# Patient Record
Sex: Male | Born: 1935 | Race: White | Hispanic: No | Marital: Married | Smoking: Former smoker
Health system: Southern US, Community
[De-identification: ages and names within clinical notes are randomized; demographics above are authoritative.]

## PROBLEM LIST (undated history)

## (undated) DIAGNOSIS — C801 Malignant (primary) neoplasm, unspecified: Secondary | ICD-10-CM

## (undated) DIAGNOSIS — I10 Essential (primary) hypertension: Secondary | ICD-10-CM

## (undated) DIAGNOSIS — K219 Gastro-esophageal reflux disease without esophagitis: Secondary | ICD-10-CM

## (undated) DIAGNOSIS — E785 Hyperlipidemia, unspecified: Secondary | ICD-10-CM

## (undated) DIAGNOSIS — E079 Disorder of thyroid, unspecified: Secondary | ICD-10-CM

## (undated) DIAGNOSIS — R7303 Prediabetes: Secondary | ICD-10-CM

## (undated) HISTORY — DX: Disorder of thyroid, unspecified: E07.9

## (undated) HISTORY — DX: Hyperlipidemia, unspecified: E78.5

## (undated) HISTORY — DX: Prediabetes: R73.03

## (undated) HISTORY — DX: Essential (primary) hypertension: I10

## (undated) HISTORY — DX: Gastro-esophageal reflux disease without esophagitis: K21.9

---

## 1998-06-30 ENCOUNTER — Encounter: Payer: Self-pay | Admitting: Internal Medicine

## 1998-06-30 ENCOUNTER — Ambulatory Visit (HOSPITAL_COMMUNITY): Admission: RE | Admit: 1998-06-30 | Discharge: 1998-06-30 | Payer: Self-pay | Admitting: Internal Medicine

## 2000-01-23 ENCOUNTER — Encounter: Payer: Self-pay | Admitting: Emergency Medicine

## 2000-01-23 ENCOUNTER — Encounter: Payer: Self-pay | Admitting: Internal Medicine

## 2000-01-23 ENCOUNTER — Emergency Department (HOSPITAL_COMMUNITY): Admission: EM | Admit: 2000-01-23 | Discharge: 2000-01-23 | Payer: Self-pay | Admitting: Emergency Medicine

## 2000-06-11 HISTORY — PX: CYSTOSCOPY: SUR368

## 2000-11-20 ENCOUNTER — Encounter: Payer: Self-pay | Admitting: Urology

## 2000-11-20 ENCOUNTER — Ambulatory Visit (HOSPITAL_COMMUNITY): Admission: RE | Admit: 2000-11-20 | Discharge: 2000-11-20 | Payer: Self-pay | Admitting: Urology

## 2000-11-25 ENCOUNTER — Ambulatory Visit (HOSPITAL_COMMUNITY): Admission: RE | Admit: 2000-11-25 | Discharge: 2000-11-25 | Payer: Self-pay | Admitting: Urology

## 2001-08-05 ENCOUNTER — Encounter: Payer: Self-pay | Admitting: Internal Medicine

## 2001-08-05 ENCOUNTER — Ambulatory Visit (HOSPITAL_COMMUNITY): Admission: RE | Admit: 2001-08-05 | Discharge: 2001-08-05 | Payer: Self-pay | Admitting: Internal Medicine

## 2002-09-30 ENCOUNTER — Ambulatory Visit (HOSPITAL_COMMUNITY): Admission: RE | Admit: 2002-09-30 | Discharge: 2002-09-30 | Payer: Self-pay | Admitting: Internal Medicine

## 2002-09-30 ENCOUNTER — Encounter: Payer: Self-pay | Admitting: Internal Medicine

## 2005-12-27 ENCOUNTER — Emergency Department (HOSPITAL_COMMUNITY): Admission: EM | Admit: 2005-12-27 | Discharge: 2005-12-27 | Payer: Self-pay | Admitting: Emergency Medicine

## 2013-04-29 ENCOUNTER — Other Ambulatory Visit: Payer: Self-pay | Admitting: Internal Medicine

## 2013-05-12 ENCOUNTER — Encounter: Payer: Self-pay | Admitting: Internal Medicine

## 2013-05-12 ENCOUNTER — Ambulatory Visit (INDEPENDENT_AMBULATORY_CARE_PROVIDER_SITE_OTHER): Payer: Medicare Other | Admitting: Internal Medicine

## 2013-05-12 VITALS — BP 142/80 | HR 56 | Temp 98.2°F | Resp 18 | Ht 71.5 in | Wt 226.6 lb

## 2013-05-12 DIAGNOSIS — R7309 Other abnormal glucose: Secondary | ICD-10-CM

## 2013-05-12 DIAGNOSIS — I10 Essential (primary) hypertension: Secondary | ICD-10-CM | POA: Insufficient documentation

## 2013-05-12 DIAGNOSIS — Z125 Encounter for screening for malignant neoplasm of prostate: Secondary | ICD-10-CM

## 2013-05-12 DIAGNOSIS — E669 Obesity, unspecified: Secondary | ICD-10-CM | POA: Insufficient documentation

## 2013-05-12 DIAGNOSIS — Z1212 Encounter for screening for malignant neoplasm of rectum: Secondary | ICD-10-CM

## 2013-05-12 DIAGNOSIS — Z79899 Other long term (current) drug therapy: Secondary | ICD-10-CM

## 2013-05-12 DIAGNOSIS — E782 Mixed hyperlipidemia: Secondary | ICD-10-CM | POA: Insufficient documentation

## 2013-05-12 DIAGNOSIS — E039 Hypothyroidism, unspecified: Secondary | ICD-10-CM | POA: Insufficient documentation

## 2013-05-12 DIAGNOSIS — M159 Polyosteoarthritis, unspecified: Secondary | ICD-10-CM | POA: Insufficient documentation

## 2013-05-12 DIAGNOSIS — E559 Vitamin D deficiency, unspecified: Secondary | ICD-10-CM | POA: Insufficient documentation

## 2013-05-12 LAB — HEPATIC FUNCTION PANEL
AST: 17 U/L (ref 0–37)
Albumin: 4.1 g/dL (ref 3.5–5.2)
Alkaline Phosphatase: 57 U/L (ref 39–117)
Bilirubin, Direct: 0.3 mg/dL (ref 0.0–0.3)
Indirect Bilirubin: 0.7 mg/dL (ref 0.0–0.9)
Total Bilirubin: 1 mg/dL (ref 0.3–1.2)

## 2013-05-12 LAB — CBC WITH DIFFERENTIAL/PLATELET
Basophils Absolute: 0 10*3/uL (ref 0.0–0.1)
Eosinophils Relative: 1 % (ref 0–5)
HCT: 42.7 % (ref 39.0–52.0)
Lymphocytes Relative: 20 % (ref 12–46)
Lymphs Abs: 1.6 10*3/uL (ref 0.7–4.0)
MCH: 30.6 pg (ref 26.0–34.0)
MCV: 88.4 fL (ref 78.0–100.0)
Monocytes Absolute: 0.9 10*3/uL (ref 0.1–1.0)
Neutrophils Relative %: 68 % (ref 43–77)
Platelets: 262 10*3/uL (ref 150–400)
RBC: 4.83 MIL/uL (ref 4.22–5.81)
RDW: 15.6 % — ABNORMAL HIGH (ref 11.5–15.5)
WBC: 8.1 10*3/uL (ref 4.0–10.5)

## 2013-05-12 LAB — BASIC METABOLIC PANEL WITH GFR
CO2: 31 mEq/L (ref 19–32)
Calcium: 8.7 mg/dL (ref 8.4–10.5)
Chloride: 103 mEq/L (ref 96–112)
GFR, Est Non African American: 77 mL/min
Glucose, Bld: 102 mg/dL — ABNORMAL HIGH (ref 70–99)
Potassium: 4.4 mEq/L (ref 3.5–5.3)
Sodium: 140 mEq/L (ref 135–145)

## 2013-05-12 LAB — TSH: TSH: 1.874 u[IU]/mL (ref 0.350–4.500)

## 2013-05-12 LAB — LIPID PANEL
HDL: 41 mg/dL (ref >39)
Total CHOL/HDL Ratio: 3.2 Ratio
Triglycerides: 45 mg/dL (ref <150)
VLDL: 9 mg/dL (ref 0–40)

## 2013-05-12 LAB — MAGNESIUM: Magnesium: 2 mg/dL (ref 1.5–2.5)

## 2013-05-12 NOTE — Progress Notes (Signed)
Patient ID: Kevin Ellison, male   DOB: December 05, 1933, 79 y.o.   MRN: [id number]  Annual Screening Comprehensive Examination  This very nice 79 yo MWM  presents for complete physical.  Patient has been followed for HTN (1994), Prediabetes, DJD, Hyperlipidemia, and Vit D Deficiency.   Patient's BP has been controlled at home. Patient denies any cardiac Symptoms as chest pain, palpitations, shortness of breath, dizziness or ankle swelling.   Patient's hyperlipidemia is controlled with diet and medications. Patient denies myalgias or other medication SE's. Last cholesterol last visit was 136, HDL 45, and LDL 77 at goal in September.     Patient has prediabetes with prior borderline high A1c 5.7% last year and wit 25# weight loss over the last year with better diet last A1c dropped to 5.0% in June this year. Patient denies reactive hypoglycemic symptoms, visual blurring, diabetic polys, or paresthesias.    Patient has DJD, with hx/0o multiple arthralgias apparently controlled on meloxicam.    Finally, patient has history of Vitamin D Deficiency with last vitamin D 78 in June.       Medication List         bisoprolol-hydrochlorothiazide 10-6.25 MG per tablet  Commonly known as:  ZIAC  Take one tablet by mouth every morning  for blood pressure     finasteride 1 MG tablet  Commonly known as:  PROPECIA  Take 1 mg by mouth daily.     finasteride 5 MG tablet  Commonly known as:  PROSCAR     FLUVIRIN Inj injection  Generic drug:  influenza (>/= 3 years) inactive virus vaccine     levothyroxine 50 MCG tablet  Commonly known as:  SYNTHROID, LEVOTHROID     losartan 100 MG tablet  Commonly known as:  COZAAR     meloxicam 15 MG tablet  Commonly known as:  MOBIC  Take 15 mg by mouth daily.     minoxidil 10 MG tablet  Commonly known as:  LONITEN  Take 10 mg by mouth daily.     pravastatin 40 MG tablet  Commonly known as:  PRAVACHOL  Take 40 mg by mouth daily.     tamsulosin 0.4 MG Caps  capsule  Commonly known as:  FLOMAX  Take 0.4 mg by mouth.         Allergies  Allergen Reactions  . Cardura [Doxazosin Mesylate]     History reviewed. No pertinent past medical history.  History reviewed. No pertinent past surgical history.  No family history on file.  History   Social History  . Marital Status: Married x 56 yrs - wife 23 yo    Spouse Name: N/A    Number of Children: N/A  . Years of Education: N/A   Occupational History  . Not on file.   Social History Main Topics  . Smoking status: cig x 15 yrs - quit 1989  . Smokeless tobacco: none  . Alcohol Use: 1.0 oz/week    2 drink(s) per week  . Sexual Activity: Not on file      ROS Constitutional: Denies fever, chills, weight loss/gain, headaches, insomnia, fatigue, night sweats, and change in appetite. Eyes: Denies redness, blurred vision, diplopia, discharge, itchy, watery eyes.  ENT: Denies discharge, congestion, post nasal drip, epistaxis, sore throat, earache, hearing loss, dental pain, Tinnitus, Vertigo, Sinus pain, snoring.  Cardio: Denies chest pain, palpitations, irregular heartbeat, syncope, dyspnea, diaphoresis, orthopnea, PND, claudication, edema Respiratory: denies cough, dyspnea, DOE, pleurisy, hoarseness, laryngitis, wheezing.  Gastrointestinal: Denies dysphagia, heartburn,  reflux, water brash, pain, cramps, nausea, vomiting, bloating, diarrhea, constipation, hematemesis, melena, hematochezia, jaundice, hemorrhoids Genitourinary: Denies dysuria, frequency, urgency, nocturia, hesitancy, discharge, hematuria, flank pain Musculoskeletal: Denies arthralgia, myalgia, stiffness, Jt. Swelling, pain, limp, and strain/sprain. Skin: Denies puritis, rash, hives, warts, acne, eczema, changing in skin lesion Neuro: Weakness, tremor, incoordination, spasms, paresthesia, pain Psychiatric: Denies confusion, memory loss, sensory loss Endocrine: Denies change in weight, skin, hair change, nocturia, and  paresthesia, diabetic polys, visual blurring, hyper /hypo glycemic episodes.  Heme/Lymph: No excessive bleeding, bruising, enlarged lymph nodes.  Filed Vitals:   05/12/13 0904  BP: 142/80  Pulse: 56  Temp: 98.2 F (36.8 C)  Resp: 18   Estimated body mass index is 31.17 kg/(m^2) as calculated from the following:   Height as of this encounter: 5' 11.5" (1.816 m).   Weight as of this encounter: 226 lb 9.6 oz (102.785 kg).  Physical Exam General Appearance: Well nourished, in no apparent distress. Eyes: PERRLA, EOMs, conjunctiva no swelling or erythema, normal fundi and vessels. Sinuses: No frontal/maxillary tenderness ENT/Mouth: EACs patent / TMs  nl. Nares clear without erythema, swelling, mucoid exudates. Oral hygiene is good. No erythema, swelling, or exudate. Tongue normal, non-obstructing. Tonsils not swollen or erythematous. Hearing normal.  Neck: Supple, thyroid normal. No bruits, nodes or JVD. Respiratory: Respiratory effort normal.  BS equal and clear bilateral without rales, rhonci, wheezing or stridor. Cardio: Heart sounds are normal with regular rate and rhythm and no murmurs, rubs or gallops. Peripheral pulses are normal and equal bilaterally without edema. No aortic or femoral bruits. Chest: symmetric with normal excursions and percussion.  Abdomen: Flat, soft, with bowl sounds. Nontender, no guarding, rebound, hernias, masses, or organomegaly.  Lymphatics: Non tender without lymphadenopathy.  Genitourinary: No hernias.Testes nl. DRE - prostate nl for age - smooth & firm w/o nodules. Musculoskeletal: Full ROM all peripheral extremities, joint stability, 5/5 strength, and normal gait. Skin: Warm and dry without rashes, lesions, cyanosis, clubbing or  ecchymosis.  Neuro: Cranial nerves intact, reflexes equal bilaterally. Normal muscle tone, no cerebellar symptoms. Sensation intact.  Pysch: Awake and oriented X 3, normal affect, Insight and Judgment appropriate.   Assessment  and Plan  1. Annual Screening Examination 2. Hypertension  3. Hyperlipidemia 4. Pre Diabetes 5. Vitamin D Deficiency 6. DJD 7. Hypothyroidism   Continue prudent diet as discussed, weight control, regular exercise, and medications. Routine screening labs and tests as requested with regular follow-up as recommended.

## 2013-05-12 NOTE — Patient Instructions (Signed)

## 2013-05-13 LAB — VITAMIN D 25 HYDROXY (VIT D DEFICIENCY, FRACTURES): Vit D, 25-Hydroxy: 88 ng/mL (ref 30–89)

## 2013-05-13 LAB — URINALYSIS, MICROSCOPIC ONLY
Bacteria, UA: NONE SEEN
Casts: NONE SEEN
Crystals: NONE SEEN
Squamous Epithelial / LPF: NONE SEEN

## 2013-05-13 LAB — MICROALBUMIN / CREATININE URINE RATIO
Creatinine, Urine: 151 mg/dL
Microalb Creat Ratio: 6.6 mg/g (ref 0.0–30.0)

## 2013-05-13 LAB — PSA: PSA: 0.62 ng/mL (ref <=4.00)

## 2013-05-13 LAB — INSULIN, FASTING: Insulin fasting, serum: 9 u[IU]/mL (ref 3–28)

## 2013-05-27 ENCOUNTER — Other Ambulatory Visit: Payer: Self-pay | Admitting: Internal Medicine

## 2013-05-27 DIAGNOSIS — I10 Essential (primary) hypertension: Secondary | ICD-10-CM

## 2013-05-27 MED ORDER — FUROSEMIDE 40 MG PO TABS: 40.0000 mg | ORAL_TABLET | Freq: Every day | ORAL | Status: DC

## 2013-06-28 ENCOUNTER — Other Ambulatory Visit: Payer: Self-pay | Admitting: Internal Medicine

## 2013-08-08 DIAGNOSIS — K2100 Gastro-esophageal reflux disease with esophagitis, without bleeding: Secondary | ICD-10-CM | POA: Insufficient documentation

## 2013-08-08 DIAGNOSIS — R7303 Prediabetes: Secondary | ICD-10-CM | POA: Insufficient documentation

## 2013-08-11 ENCOUNTER — Encounter: Payer: Self-pay | Admitting: Internal Medicine

## 2013-08-11 ENCOUNTER — Ambulatory Visit (INDEPENDENT_AMBULATORY_CARE_PROVIDER_SITE_OTHER): Payer: Medicare Other | Admitting: Internal Medicine

## 2013-08-11 VITALS — BP 128/68 | HR 48 | Temp 98.1°F | Resp 18 | Wt 228.8 lb

## 2013-08-11 DIAGNOSIS — E559 Vitamin D deficiency, unspecified: Secondary | ICD-10-CM

## 2013-08-11 DIAGNOSIS — R7309 Other abnormal glucose: Secondary | ICD-10-CM

## 2013-08-11 DIAGNOSIS — E782 Mixed hyperlipidemia: Secondary | ICD-10-CM

## 2013-08-11 DIAGNOSIS — I10 Essential (primary) hypertension: Secondary | ICD-10-CM

## 2013-08-11 DIAGNOSIS — Z79899 Other long term (current) drug therapy: Secondary | ICD-10-CM

## 2013-08-11 DIAGNOSIS — B351 Tinea unguium: Secondary | ICD-10-CM

## 2013-08-11 LAB — CBC WITH DIFFERENTIAL/PLATELET
BASOS ABS: 0 10*3/uL (ref 0.0–0.1)
BASOS PCT: 0 % (ref 0–1)
EOS PCT: 1 % (ref 0–5)
Eosinophils Absolute: 0.1 10*3/uL (ref 0.0–0.7)
HEMATOCRIT: 41.6 % (ref 39.0–52.0)
Hemoglobin: 14.5 g/dL (ref 13.0–17.0)
Lymphocytes Relative: 18 % (ref 12–46)
Lymphs Abs: 1.5 10*3/uL (ref 0.7–4.0)
MCH: 30.2 pg (ref 26.0–34.0)
MCHC: 34.9 g/dL (ref 30.0–36.0)
MCV: 86.7 fL (ref 78.0–100.0)
MONO ABS: 0.8 10*3/uL (ref 0.1–1.0)
Monocytes Relative: 10 % (ref 3–12)
NEUTROS ABS: 5.8 10*3/uL (ref 1.7–7.7)
Neutrophils Relative %: 71 % (ref 43–77)
Platelets: 262 10*3/uL (ref 150–400)
RBC: 4.8 MIL/uL (ref 4.22–5.81)
RDW: 15.4 % (ref 11.5–15.5)
WBC: 8.1 10*3/uL (ref 4.0–10.5)

## 2013-08-11 LAB — BASIC METABOLIC PANEL WITH GFR
BUN: 16 mg/dL (ref 6–23)
CHLORIDE: 103 meq/L (ref 96–112)
CO2: 27 mEq/L (ref 19–32)
Calcium: 8.7 mg/dL (ref 8.4–10.5)
Creat: 1.06 mg/dL (ref 0.50–1.35)
GFR, EST AFRICAN AMERICAN: 78 mL/min
GFR, Est Non African American: 67 mL/min
GLUCOSE: 99 mg/dL (ref 70–99)
POTASSIUM: 4.3 meq/L (ref 3.5–5.3)
Sodium: 140 mEq/L (ref 135–145)

## 2013-08-11 LAB — TSH: TSH: 2.801 u[IU]/mL (ref 0.350–4.500)

## 2013-08-11 LAB — HEMOGLOBIN A1C
Hgb A1c MFr Bld: 5.4 % (ref <5.7)
Mean Plasma Glucose: 108 mg/dL (ref <117)

## 2013-08-11 LAB — LIPID PANEL
Cholesterol: 125 mg/dL (ref 0–200)
HDL: 40 mg/dL (ref >39)
LDL CALC: 71 mg/dL (ref 0–99)
Total CHOL/HDL Ratio: 3.1 Ratio
Triglycerides: 68 mg/dL (ref <150)
VLDL: 14 mg/dL (ref 0–40)

## 2013-08-11 LAB — HEPATIC FUNCTION PANEL
ALBUMIN: 4 g/dL (ref 3.5–5.2)
ALK PHOS: 57 U/L (ref 39–117)
ALT: 18 U/L (ref 0–53)
AST: 14 U/L (ref 0–37)
BILIRUBIN INDIRECT: 0.8 mg/dL (ref 0.2–1.2)
Bilirubin, Direct: 0.2 mg/dL (ref 0.0–0.3)
Total Bilirubin: 1 mg/dL (ref 0.2–1.2)
Total Protein: 6.4 g/dL (ref 6.0–8.3)

## 2013-08-11 LAB — MAGNESIUM: Magnesium: 2.1 mg/dL (ref 1.5–2.5)

## 2013-08-11 MED ORDER — TERBINAFINE HCL 250 MG PO TABS: 250.0000 mg | ORAL_TABLET | Freq: Every day | ORAL | Status: DC

## 2013-08-11 NOTE — Patient Instructions (Signed)

## 2013-08-11 NOTE — Progress Notes (Signed)
Patient ID: Kevin Ellison, male   DOB: 12-Jan-1934, 80 y.o.   MRN: [id number]    This very nice 80 y.o. MWM presents for 3 month follow up with Hypertension, Hyperlipidemia, Pre-Diabetes, DJD and Vitamin D Deficiency.    HTN predates since 38. BP has been controlled at home. Today's BP: 128/68 mmHg . Patient denies any cardiac type chest pain, palpitations, dyspnea/orthopnea/PND, dizziness, claudication, or dependent edema.   Hyperlipidemia is controlled with diet & meds.Patient denies myalgias or other med SE's. Last lipds as below are at goal Lab Results  Component Value Date   CHOL 132 05/12/2013   HDL 41 05/12/2013   LDLCALC 82 05/12/2013   TRIG 45 05/12/2013   CHOLHDL 3.2 05/12/2013      Also, the patient has history of PreDiabetes A1c 5.7% since July 2012  with last A1c of 5.6% in Dec 2014. Patient denies any symptoms of reactive hypoglycemia, diabetic polys, paresthesias or visual blurring.   Further, Patient has history of Vitamin D Deficiency of 1 in 2008 with last vitamin D of 88 in Dec 2014. Patient supplements vitamin D without any suspected side-effects.    Medication List       bisoprolol-hydrochlorothiazide 10-6.25 MG per tablet  Commonly known as:  ZIAC  Take one tablet by mouth every morning  for blood pressure     finasteride 1 MG tablet  Commonly known as:  PROPECIA  Take 1 mg by mouth daily.     finasteride 5 MG tablet  Commonly known as:  PROSCAR  Take 1 tablet by mouth daily for prostate     FLUVIRIN Inj injection  Generic drug:  influenza (>/= 3 years) inactive virus vaccine     furosemide 40 MG tablet  Commonly known as:  LASIX  Take 1 tablet (40 mg total) by mouth daily. For BP and fluid     levothyroxine 50 MCG tablet  Commonly known as:  SYNTHROID, LEVOTHROID     losartan 100 MG tablet  Commonly known as:  COZAAR     meloxicam 15 MG tablet  Commonly known as:  MOBIC  Take 15 mg by mouth daily.     minoxidil 10 MG tablet  Commonly known as:   LONITEN  Take 10 mg by mouth daily.     pravastatin 40 MG tablet  Commonly known as:  PRAVACHOL  Take 40 mg by mouth daily.     tamsulosin 0.4 MG Caps capsule  Commonly known as:  FLOMAX  Take 0.4 mg by mouth.     terbinafine 250 MG tablet  Commonly known as:  LAMISIL  Take 1 tablet (250 mg total) by mouth daily. For toenail fungus         Allergies  Allergen Reactions  . Ace Inhibitors     cough  . Cardura [Doxazosin Mesylate]     PMHx:   Past Medical History  Diagnosis Date  . Hyperlipidemia   . Hypertension   . Thyroid disease   . Prediabetes   . GERD (gastroesophageal reflux disease)     FHx:    Reviewed / unchanged  SHx:    Reviewed / unchanged  Systems Review: Constitutional: Denies fever, chills, wt changes, headaches, insomnia, fatigue, night sweats, change in appetite. Eyes: Denies redness, blurred vision, diplopia, discharge, itchy, watery eyes.  ENT: Denies discharge, congestion, post nasal drip, epistaxis, sore throat, earache, hearing loss, dental pain, tinnitus, vertigo, sinus pain, snoring.  CV: Denies chest pain, palpitations, irregular heartbeat, syncope, dyspnea, diaphoresis, orthopnea,  PND, claudication, edema. Respiratory: denies cough, dyspnea, DOE, pleurisy, hoarseness, laryngitis, wheezing.  Gastrointestinal: Denies dysphagia, odynophagia, heartburn, reflux, water brash, abdominal pain or cramps, nausea, vomiting, bloating, diarrhea, constipation, hematemesis, melena, hematochezia,  or hemorrhoids. Genitourinary: Denies dysuria, frequency, urgency, nocturia, hesitancy, discharge, hematuria, flank pain. Musculoskeletal: Denies arthralgias, myalgias, stiffness, jt. swelling, pain, limp, strain/sprain.  Skin: Denies pruritus, rash, hives, warts, acne, eczema, change in skin lesion(s). Neuro: No weakness, tremor, incoordination, spasms, paresthesia, or pain. Psychiatric: Denies confusion, memory loss, or sensory loss. Endo: Denies change in  weight, skin, hair change.  Heme/Lymph: No excessive bleeding, bruising, orenlarged lymph nodes.  BP: 128/68  Pulse: 48  Temp: 98.1 F (36.7 C)  Resp: 18    Estimated body mass index is 31.47 kg/(m^2) as calculated from the following:   Height as of 05/12/13: 5' 11.5" (1.816 m).   Weight as of this encounter: 228 lb 12.8 oz (103.783 kg).  On Exam: Appears well nourished - in no distress. Eyes: PERRLA, EOMs, conjunctiva no swelling or erythema. Sinuses: No frontal/maxillary tenderness ENT/Mouth: EAC's clear, TM's nl w/o erythema, bulging. Nares clear w/o erythema, swelling, exudates. Oropharynx clear without erythema or exudates. Oral hygiene is good. Tongue normal, non obstructing. Hearing intact.  Neck: Supple. Thyroid nl. Car 2+/2+ without bruits, nodes or JVD. Chest: Respirations nl with BS clear & equal w/o rales, rhonchi, wheezing or stridor.  Cor: Heart sounds normal w/ regular rate and rhythm without sig. murmurs, gallops, clicks, or rubs. Peripheral pulses normal and equal  without edema.  Abdomen: Soft & bowel sounds normal. Non-tender w/o guarding, rebound, hernias, masses, or organomegaly.  Lymphatics: Unremarkable.  Musculoskeletal: Full ROM all peripheral extremities, joint stability, 5/5 strength, and normal gait.  Skin: Warm, dry without exposed rashes, lesions, ecchymosis apparent. Chalky thicken discolored dystropic toenails Neuro: Cranial nerves intact, reflexes equal bilaterally. Sensory-motor testing grossly intact. Tendon reflexes grossly intact.  Pysch: Alert & oriented x 3. Insight and judgement nl & appropriate. No ideations.  Assessment and Plan:  1. Hypertension - Continue monitor blood pressure at home. Continue diet/meds same.  2. Hyperlipidemia - Continue diet/meds, exercise,& lifestyle modifications. Continue monitor periodic cholesterol/liver & renal functions   3. Pre-diabetes/Insulin Resistance - Continue diet, exercise, lifestyle modifications.  Monitor appropriate labs.  4. Vitamin D Deficiency - Continue supplementation.  5. DJD  6. Onychomycosis Toenails  7. Obesity (BMI 31.47)  Recommended regular exercise, BP monitoring, weight control, and discussed med and SE's. Recommended labs to assess and monitor clinical status. Further disposition pending results of labs.

## 2013-08-12 LAB — INSULIN, FASTING: INSULIN FASTING, SERUM: 13 u[IU]/mL (ref 3–28)

## 2013-08-12 LAB — VITAMIN D 25 HYDROXY (VIT D DEFICIENCY, FRACTURES): VIT D 25 HYDROXY: 83 ng/mL (ref 30–89)

## 2013-11-05 ENCOUNTER — Other Ambulatory Visit: Payer: Self-pay | Admitting: Internal Medicine

## 2013-11-05 MED ORDER — LEVOTHYROXINE SODIUM 50 MCG PO TABS: 50.0000 ug | ORAL_TABLET | Freq: Every day | ORAL | Status: DC

## 2013-11-13 ENCOUNTER — Encounter: Payer: Self-pay | Admitting: Internal Medicine

## 2013-11-13 ENCOUNTER — Ambulatory Visit (INDEPENDENT_AMBULATORY_CARE_PROVIDER_SITE_OTHER): Payer: Medicare Other | Admitting: Internal Medicine

## 2013-11-13 VITALS — BP 138/74 | HR 60 | Temp 98.1°F | Resp 16 | Ht 72.0 in | Wt 228.8 lb

## 2013-11-13 DIAGNOSIS — R7309 Other abnormal glucose: Secondary | ICD-10-CM

## 2013-11-13 DIAGNOSIS — E782 Mixed hyperlipidemia: Secondary | ICD-10-CM

## 2013-11-13 DIAGNOSIS — Z79899 Other long term (current) drug therapy: Secondary | ICD-10-CM

## 2013-11-13 DIAGNOSIS — R7303 Prediabetes: Secondary | ICD-10-CM

## 2013-11-13 DIAGNOSIS — I10 Essential (primary) hypertension: Secondary | ICD-10-CM

## 2013-11-13 DIAGNOSIS — E559 Vitamin D deficiency, unspecified: Secondary | ICD-10-CM

## 2013-11-13 LAB — CBC WITH DIFFERENTIAL/PLATELET
BASOS ABS: 0 10*3/uL (ref 0.0–0.1)
Basophils Relative: 0 % (ref 0–1)
EOS PCT: 1 % (ref 0–5)
Eosinophils Absolute: 0.1 10*3/uL (ref 0.0–0.7)
HCT: 40.4 % (ref 39.0–52.0)
Hemoglobin: 14 g/dL (ref 13.0–17.0)
LYMPHS ABS: 1.5 10*3/uL (ref 0.7–4.0)
LYMPHS PCT: 19 % (ref 12–46)
MCH: 30 pg (ref 26.0–34.0)
MCHC: 34.7 g/dL (ref 30.0–36.0)
MCV: 86.7 fL (ref 78.0–100.0)
Monocytes Absolute: 0.8 10*3/uL (ref 0.1–1.0)
Monocytes Relative: 10 % (ref 3–12)
NEUTROS PCT: 70 % (ref 43–77)
Neutro Abs: 5.6 10*3/uL (ref 1.7–7.7)
Platelets: 274 10*3/uL (ref 150–400)
RBC: 4.66 MIL/uL (ref 4.22–5.81)
RDW: 15.8 % — AB (ref 11.5–15.5)
WBC: 8 10*3/uL (ref 4.0–10.5)

## 2013-11-13 LAB — HEMOGLOBIN A1C
Hgb A1c MFr Bld: 5.3 % (ref <5.7)
Mean Plasma Glucose: 105 mg/dL (ref <117)

## 2013-11-13 NOTE — Progress Notes (Signed)
Patient ID: Kevin Ellison, male   DOB: 10/30/1933, 80 y.o.   MRN: [id number]    This very nice 80 y.o.MWM presents for 3 month follow up with Hypertension, Hyperlipidemia, Pre-Diabetes and Vitamin D Deficiency.    HTN predates since 42. BP has been controlled at home. Today's BP: 138/74 mmHg. Patient has Stage 2 CKD (GFR 67 ml/min) felt due to Hypertensive Nephrosclerosis. Patient denies any cardiac type chest pain, palpitations, dyspnea/orthopnea/PND, dizziness, claudication, or dependent edema.   Hyperlipidemia is controlled with diet & meds. Last Lipids in March as below were at goal. Patient denies myalgias or other med SE's.  Lab Results  Component Value Date   CHOL 125 08/11/2013   HDL 40 08/11/2013   LDLCALC 71 08/11/2013   TRIG 68 08/11/2013   CHOLHDL 3.1 08/11/2013    Also, the patient has history of Obesity (BMI 31) and PreDiabetes with A1c 5.7% in July 2012  and last A1c was 5.4% in Mar 2015. Patient denies any symptoms of reactive hypoglycemia, diabetic polys, paresthesias or visual blurring.   Further, Patient has history of Vitamin D Deficiency of 44 in 2008 with last vitamin D was 84 in Mar 2015. Patient supplements vitamin D without any suspected side-effects.   Medication List   bisoprolol-hydrochlorothiazide 10-6.25 MG per tablet  Commonly known as:  ZIAC  Take one tablet by mouth every morning  for blood pressure     finasteride 5 MG tablet  Commonly known as:  PROSCAR  Take 1 tablet by mouth daily for prostate     FLUVIRIN Inj injection  Generic drug:  influenza (>/= 3 years) inactive virus vaccine     furosemide 40 MG tablet  Commonly known as:  LASIX  Take 1 tablet (40 mg total) by mouth daily. For BP and fluid     levothyroxine 50 MCG tablet  Commonly known as:  SYNTHROID, LEVOTHROID  Take 1 tablet (50 mcg total) by mouth daily before breakfast.     losartan 100 MG tablet  Commonly known as:  COZAAR     meloxicam 15 MG tablet  Commonly known as:  MOBIC  Take 15  mg by mouth daily.     minoxidil 10 MG tablet  Commonly known as:  LONITEN  Take 10 mg by mouth daily.     pravastatin 40 MG tablet  Commonly known as:  PRAVACHOL  Take 40 mg by mouth daily.     tamsulosin 0.4 MG Caps capsule  Commonly known as:  FLOMAX  Take 0.4 mg by mouth.     terbinafine 250 MG tablet  Commonly known as:  LAMISIL  Take 1 tablet (250 mg total) by mouth daily. For toenail fungus       Allergies  Allergen Reactions  . Ace Inhibitors     cough  . Cardura [Doxazosin Mesylate]    PMHx:   Past Medical History  Diagnosis Date  . Hyperlipidemia   . Hypertension   . Thyroid disease   . Prediabetes   . GERD (gastroesophageal reflux disease)    FHx:    Reviewed / unchanged  SHx:    Reviewed / unchanged   Systems Review: Constitutional: Denies fever, chills, wt changes, headaches, insomnia, fatigue, night sweats, change in appetite. Eyes: Denies redness, blurred vision, diplopia, discharge, itchy, watery eyes.  ENT: Denies discharge, congestion, post nasal drip, epistaxis, sore throat, earache, hearing loss, dental pain, tinnitus, vertigo, sinus pain, snoring.  CV: Denies chest pain, palpitations, irregular heartbeat, syncope, dyspnea, diaphoresis, orthopnea,  PND, claudication or edema. Respiratory: denies cough, dyspnea, DOE, pleurisy, hoarseness, laryngitis, wheezing.  Gastrointestinal: Denies dysphagia, odynophagia, heartburn, reflux, water brash, abdominal pain or cramps, nausea, vomiting, bloating, diarrhea, constipation, hematemesis, melena, hematochezia  or hemorrhoids. Genitourinary: Denies dysuria, frequency, urgency, nocturia, hesitancy, discharge, hematuria or flank pain. Musculoskeletal: Denies arthralgias, myalgias, stiffness, jt. swelling, pain, limping or strain/sprain.  Skin: Denies pruritus, rash, hives, warts, acne, eczema or change in skin lesion(s). Neuro: No weakness, tremor, incoordination, spasms, paresthesia or pain. Psychiatric:  Denies confusion, memory loss or sensory loss. Endo: Denies change in weight, skin or hair change.  Heme/Lymph: No excessive bleeding, bruising or enlarged lymph nodes.  Exam:  BP 138/74  P 60  T 98.1 F  R 16  Ht 6'   Wt 228 lb 12.8 oz   BMI 31.02 kg/m2  Appears well nourished - in no distress. Eyes: PERRLA, EOMs, conjunctiva no swelling or erythema. Sinuses: No frontal/maxillary tenderness ENT/Mouth: EAC's clear, TM's nl w/o erythema, bulging. Nares clear w/o erythema, swelling, exudates. Oropharynx clear without erythema or exudates. Oral hygiene is good. Tongue normal, non obstructing. Hearing intact.  Neck: Supple. Thyroid nl. Car 2+/2+ without bruits, nodes or JVD. Chest: Respirations nl with BS clear & equal w/o rales, rhonchi, wheezing or stridor.  Cor: Heart sounds normal w/ regular rate and rhythm without sig. murmurs, gallops, clicks, or rubs. Peripheral pulses normal and equal  without edema.  Abdomen: Soft & bowel sounds normal. Non-tender w/o guarding, rebound, hernias, masses, or organomegaly.  Lymphatics: Unremarkable.  Musculoskeletal: Full ROM all peripheral extremities, joint stability, 5/5 strength, and normal gait.  Skin: Warm, dry without exposed rashes, lesions or ecchymosis apparent.  Neuro: Cranial nerves intact, reflexes equal bilaterally. Sensory-motor testing grossly intact. Tendon reflexes grossly intact.  Pysch: Alert & oriented x 3. Insight and judgement nl & appropriate. No ideations.  Assessment and Plan:  1. Hypertension - Continue monitor blood pressure at home. Continue diet/meds same.  2. Hyperlipidemia - Continue diet/meds, exercise,& lifestyle modifications. Continue monitor periodic cholesterol/liver & renal functions   3. Pre-diabetes/Insulin Resistance - Continue diet, exercise, lifestyle modifications. Monitor appropriate labs.  4. Vitamin D Deficiency - Continue supplementation.  Recommended regular exercise, BP monitoring, weight  control, and discussed med and SE's. Recommended labs to assess and monitor clinical status. Further disposition pending results of labs.  ROV 3 mo OV &  6 mo CPE.

## 2013-11-13 NOTE — Patient Instructions (Signed)

## 2013-11-14 LAB — LIPID PANEL
CHOL/HDL RATIO: 3 ratio
Cholesterol: 125 mg/dL (ref 0–200)
HDL: 41 mg/dL (ref >39)
LDL Cholesterol: 67 mg/dL (ref 0–99)
TRIGLYCERIDES: 87 mg/dL (ref <150)
VLDL: 17 mg/dL (ref 0–40)

## 2013-11-14 LAB — BASIC METABOLIC PANEL WITH GFR
BUN: 13 mg/dL (ref 6–23)
CO2: 29 mEq/L (ref 19–32)
CREATININE: 0.86 mg/dL (ref 0.50–1.35)
Calcium: 8.6 mg/dL (ref 8.4–10.5)
Chloride: 103 mEq/L (ref 96–112)
GFR, EST NON AFRICAN AMERICAN: 84 mL/min
GFR, Est African American: >89 mL/min
Glucose, Bld: 95 mg/dL (ref 70–99)
Potassium: 4 mEq/L (ref 3.5–5.3)
Sodium: 141 mEq/L (ref 135–145)

## 2013-11-14 LAB — HEPATIC FUNCTION PANEL
ALBUMIN: 4 g/dL (ref 3.5–5.2)
ALK PHOS: 58 U/L (ref 39–117)
ALT: 12 U/L (ref 0–53)
AST: 14 U/L (ref 0–37)
BILIRUBIN DIRECT: 0.2 mg/dL (ref 0.0–0.3)
BILIRUBIN TOTAL: 0.8 mg/dL (ref 0.2–1.2)
Indirect Bilirubin: 0.6 mg/dL (ref 0.2–1.2)
Total Protein: 6.2 g/dL (ref 6.0–8.3)

## 2013-11-14 LAB — VITAMIN D 25 HYDROXY (VIT D DEFICIENCY, FRACTURES): Vit D, 25-Hydroxy: 80 ng/mL (ref 30–89)

## 2013-11-14 LAB — MAGNESIUM: Magnesium: 1.9 mg/dL (ref 1.5–2.5)

## 2013-11-14 LAB — INSULIN, FASTING: Insulin fasting, serum: 13 u[IU]/mL (ref 3–28)

## 2013-11-14 LAB — TSH: TSH: 4.113 u[IU]/mL (ref 0.350–4.500)

## 2013-12-16 ENCOUNTER — Other Ambulatory Visit: Payer: Self-pay | Admitting: Internal Medicine

## 2013-12-16 DIAGNOSIS — L0202 Furuncle of face: Secondary | ICD-10-CM

## 2013-12-16 MED ORDER — DOXYCYCLINE HYCLATE 100 MG PO CAPS: ORAL_CAPSULE | ORAL | Status: DC

## 2013-12-26 ENCOUNTER — Other Ambulatory Visit: Payer: Self-pay | Admitting: Internal Medicine

## 2014-01-02 ENCOUNTER — Other Ambulatory Visit: Payer: Self-pay | Admitting: Internal Medicine

## 2014-01-13 ENCOUNTER — Other Ambulatory Visit: Payer: Self-pay | Admitting: Internal Medicine

## 2014-01-30 ENCOUNTER — Other Ambulatory Visit: Payer: Self-pay | Admitting: Internal Medicine

## 2014-02-08 ENCOUNTER — Telehealth: Payer: Self-pay | Admitting: *Deleted

## 2014-02-08 NOTE — Telephone Encounter (Signed)
[facility] called ti verify dose of Minoxidil.  Per Dr Melford Aase, patient should 10 mg daily which is 1 tab.  Left message on machine to inform [facility].

## 2014-02-16 NOTE — Progress Notes (Signed)
Patient ID: Kevin Ellison, male   DOB: 07/06/1933, 80 y.o.   MRN: [id number]  MEDICARE ANNUAL WELLNESS VISIT AND EXAM  Assessment:   1. Hypertension - TSH  2. Hyperlipidemia - Lipid panel  3. Prediabetes - Hemoglobin A1c - Insulin, fasting  4. Vitamin D Deficiency - Vit D  25 hydroxy (rtn osteoporosis monitoring)  5. Encounter for long-term (current) use of other medications - CBC with Differential - BASIC METABOLIC PANEL WITH GFR - Hepatic function panel - Magnesium  6. Need for prophylactic vaccination and inoculation against influenza - Flu vaccine HIGH DOSE PF (Fluzone Tri High dose)  7. Need for prophylactic vaccination against Streptococcus pneumoniae (pneumococcus) - Pneumococcal conjugate vaccine 13-valent  Plan:   During the course of the visit the patient was educated and counseled about appropriate screening and preventive services including:    Pneumococcal vaccine   Influenza vaccine  Td vaccine  Screening electrocardiogram  Bone densitometry screening  Colorectal cancer screening  Diabetes screening  Glaucoma screening  Nutrition counseling   Advanced directives: requested  Screening recommendations, referrals: Vaccinations: Tdap vaccine not indicated Influenza vaccine ordered Pneumococcal vaccine needs prevnar Shingles vaccine not indicated Hep B vaccine not indicated  Nutrition assessed and recommended  Colonoscopy not indicated Recommended yearly ophthalmology/optometry visit for glaucoma screening and checkup Recommended yearly dental visit for hygiene and checkup Advanced directives - declined  Conditions/risks identified: BMI: Discussed weight loss, diet, and increase physical activity.  Increase physical activity: AHA recommends 150 minutes of physical activity a week.  Medications reviewed PreDiabetes is at goal, ARB therapy: Yes. Urinary Incontinence is not an issue: discussed non pharmacology and pharmacology options.   Fall risk: low- discussed PT, home fall assessment, medications.    Subjective:  Kevin Ellison is a 80 y.o. male who presents for Medicare Annual Wellness Visit and 6 month OV. Date of last medicare wellness visit is unknown.  This very nice 80 y.o. male presents for 3 month follow up with Hypertension, Hyperlipidemia, Pre-Diabetes and Vitamin D Deficiency.    Patient is treated for HTN & BP has been controlled at home. Today's bloo Patient denies any cardiac type chest pain, palpitations, dyspnea/orthopnea/PND, dizziness, claudication, or dependent edema.   Hyperlipidemia is controlled with diet & meds. Patient denies myalgias or other med SE's. Last Lipids were at goal.  Lab Results  Component Value Date   CHOL 125 11/13/2013   HDL 41 11/13/2013   LDLCALC 67 11/13/2013   TRIG 87 11/13/2013   CHOLHDL 3.0 11/13/2013    Also, the patient has history of PreDiabetes and patient denies any symptoms of reactive hypoglycemia, diabetic polys, paresthesias or visual blurring.  Last A1c was  Lab Results  Component Value Date   HGBA1C 5.3 11/13/2013    Further, Patient has history of Vitamin D Deficiency and patient supplements vitamin D without any suspected side-effects. Last vitamin D was    Names of Other Physician/Practitioners you currently use: 1. [facility] here for primary care 2.  eye doctor, none - encouraged for glaucoma screening 3. Dentist in [place], last visit 6 months  Patient Care Team: Unk Pinto, MD as PCP - General (Internal Medicine) Wynona Neat. Shana Chute, MD as Referring Physician (Gastroenterology)  Medication Review: Medication Sig  . bisoprolol-hydrochlorothiazide (ZIAC) 10-6.25 MG per tablet Take one tablet by mouth every morning  for blood pressure  . doxycycline (VIBRAMYCIN) 100 MG capsule Take 2 capsules at once on a full stomach, then take 1 capsule  2  x day on a full stomach for infection for 2 weeks  . finasteride (PROSCAR) 5  MG tablet TAKE 1 TABLET BY MOUTH DAILY FOR PROSTATE  . FLUVIRIN INJ injection   . furosemide (LASIX) 40 MG tablet Take 1 tablet (40 mg total) by mouth daily. For BP and fluid  . levothyroxine (SYNTHROID, LEVOTHROID) 50 MCG tablet Take 1 tablet (50 mcg total) by mouth daily before breakfast.  . losartan (COZAAR) 100 MG tablet TAKE ONE TABLET BY MOUTH EVERY DAY FOR BLOOD PRESSURE   . meloxicam (MOBIC) 15 MG tablet Take 15 mg by mouth daily.  . minoxidil (LONITEN) 10 MG tablet Take 1/4 to 1/2 to 1 tablet daily as directed for BP  . pravastatin (PRAVACHOL) 40 MG tablet Take one tablet by mouth at bedtime for cholesterol  . tamsulosin (FLOMAX) 0.4 MG CAPS capsule TAKE ONE CAPSULE BY MOUTH ONE TIME DAILY   . terbinafine (LAMISIL) 250 MG tablet Take 1 tablet (250 mg total) by mouth daily. For toenail fungus   Current Problems (verified) Patient Active Problem List   Diagnosis Date Noted  . Encounter for long-term (current) use of other medications 08/11/2013  . Prediabetes   . GERD   . Hypertension 05/12/2013  . Hyperlipidemia 05/12/2013  . Vitamin D Deficiency 05/12/2013  . DJD 05/12/2013  . Hypothyroidism 05/12/2013  . Obesity (BMI 30-39.9) 05/12/2013   Screening Tests Health Maintenance  Topic Date Due  . Colonoscopy  12/15/1985  . Pneumococcal Polysaccharide Vaccine Age 64 And Over  12/15/2000  . Influenza Vaccine  01/09/2014  . Tetanus/tdap  10/23/2016  . Zostavax  Completed   Immunization History  Administered Date(s) Administered  . Influenza Split 03/09/2013  . Influenza, High Dose Seasonal PF 02/17/2014  . Pneumococcal Conjugate-13 02/17/2014  . Td 10/24/2006  . Zoster 05/17/2009   Preventative care: Last colonoscopy: 2006 due 2016  Prior vaccinations: TD:  2008  Influenza: 2014 DUE Pneumococcal: 2005 Prevnar: DUE Shingles/Zostavax: 2010  History reviewed: allergies, current medications, past family history, past medical history, past social history, past surgical  history and problem list  Risk Factors: Tobacco History  Substance Use Topics  . Smoking status: Former Research scientist (life sciences)  . Smokeless tobacco: Former Systems developer    Quit date: 06/12/1987  . Alcohol Use: 1.0 oz/week    2 drink(s) per week     Comment: occasional beer  14 beers a week   He does not smoke.  Patient is a former smoker. Are there smokers in your home (other than you)?  No  Alcohol Current alcohol use: social drinker  Caffeine Current caffeine use: coffee 3 /day  Exercise Current exercise: golfs  Nutrition/Diet Current diet: in general, a "healthy" diet    Cardiac risk factors: advanced age (older than 20 for men, 83 for women), dyslipidemia, hypertension, male gender, obesity (BMI >= 30 kg/m2) and sedentary lifestyle.  Depression Screen (Note: if answer to either of the following is "Yes", a more complete depression screening is indicated)   Q1: Over the past two weeks, have you felt down, depressed or hopeless? No  Q2: Over the past two weeks, have you felt little interest or pleasure in doing things? No  Have you lost interest or pleasure in daily life? No  Do you often feel hopeless? No  Do you cry easily over simple problems? No  Activities of Daily Living In your present state of health, do you have any difficulty performing the following activities?:  Driving? No Managing money?  No Feeding yourself?  No Getting from bed to chair? No Climbing a flight of stairs? No Preparing food and eating?: No Bathing or showering? No Getting dressed: No Getting to the toilet? No Using the toilet:No Moving around from place to place: No In the past year have you fallen or had a near fall?:No   Are you sexually active?  No  Do you have more than one partner?  NA  Vision Difficulties: No  Hearing Difficulties: No Do you often ask people to speak up or repeat themselves? No Do you experience ringing or noises in your ears? No Do you have difficulty understanding soft or  whispered voices? No  Cognition  Do you feel that you have a problem with memory?No  Do you often misplace items? No  Do you feel safe at home?  Yes  Advanced directives Does patient have a [place]? No Does patient have a Living Will? No   Objective:     Blood pressure 120/84, pulse 56, temperature 99.1 F (37.3 C), temperature source Temporal, resp. rate 16, height 6' (1.829 m), weight 228 lb (103.42 kg). Body mass index is 30.92 kg/(m^2).  General appearance: alert, no distress, WD/WN, male Cognitive Testing  Alert? Yes  Normal Appearance?Yes  Oriented to person? Yes  Place? Yes   Time? Yes  Recall of three objects?  Yes  Can perform simple calculations? Yes  Displays appropriate judgment?Yes  Can read the correct time from a watch face?Yes  HEENT: normocephalic, sclerae anicteric, TMs pearly, nares patent, no discharge or erythema, pharynx normal Oral cavity: MMM, no lesions Neck: supple, no lymphadenopathy, no thyromegaly, no masses Heart: RRR, normal S1, S2, no murmurs Lungs: CTA bilaterally, no wheezes, rhonchi, or rales Abdomen: +bs, soft, non tender, non distended, no masses, no hepatomegaly, no splenomegaly Musculoskeletal: nontender, no swelling, no obvious deformity Extremities: no edema, no cyanosis, no clubbing Pulses: 2+ symmetric, upper and lower extremities, normal cap refill Neurological: alert, oriented x 3, CN2-12 intact, strength normal upper extremities and lower extremities, sensation normal throughout, DTRs 2+ throughout, no cerebellar signs, gait normal Psychiatric: normal affect, behavior normal, pleasant   Medicare Attestation I have personally reviewed: The patient's medical and social history Their use of alcohol, tobacco or illicit drugs Their current medications and supplements The patient's functional ability including ADLs,fall risks, home safety risks, cognitive, and hearing and visual impairment Diet and physical  activities Evidence for depression or mood disorders  The patient's weight, height, BMI, and visual acuity have been recorded in the chart.  I have made referrals, counseling, and provided education to the patient based on review of the above and I have provided the patient with a written personalized care plan for preventive services.     Deven Furia DAVID, MD   02/17/2014

## 2014-02-17 ENCOUNTER — Encounter: Payer: Self-pay | Admitting: Internal Medicine

## 2014-02-17 ENCOUNTER — Ambulatory Visit (INDEPENDENT_AMBULATORY_CARE_PROVIDER_SITE_OTHER): Payer: Medicare Other | Admitting: Internal Medicine

## 2014-02-17 VITALS — BP 120/84 | HR 56 | Temp 99.1°F | Resp 16 | Ht 72.0 in | Wt 228.0 lb

## 2014-02-17 DIAGNOSIS — I10 Essential (primary) hypertension: Secondary | ICD-10-CM

## 2014-02-17 DIAGNOSIS — Z79899 Other long term (current) drug therapy: Secondary | ICD-10-CM

## 2014-02-17 DIAGNOSIS — R7309 Other abnormal glucose: Secondary | ICD-10-CM

## 2014-02-17 DIAGNOSIS — E559 Vitamin D deficiency, unspecified: Secondary | ICD-10-CM

## 2014-02-17 DIAGNOSIS — Z0000 Encounter for general adult medical examination without abnormal findings: Secondary | ICD-10-CM

## 2014-02-17 DIAGNOSIS — Z23 Encounter for immunization: Secondary | ICD-10-CM

## 2014-02-17 DIAGNOSIS — E782 Mixed hyperlipidemia: Secondary | ICD-10-CM

## 2014-02-17 DIAGNOSIS — R7303 Prediabetes: Secondary | ICD-10-CM

## 2014-02-17 LAB — CBC WITH DIFFERENTIAL/PLATELET
BASOS ABS: 0 10*3/uL (ref 0.0–0.1)
Basophils Relative: 0 % (ref 0–1)
EOS PCT: 1 % (ref 0–5)
Eosinophils Absolute: 0.1 10*3/uL (ref 0.0–0.7)
HCT: 42.7 % (ref 39.0–52.0)
Hemoglobin: 14.9 g/dL (ref 13.0–17.0)
LYMPHS PCT: 19 % (ref 12–46)
Lymphs Abs: 1.5 10*3/uL (ref 0.7–4.0)
MCH: 30.3 pg (ref 26.0–34.0)
MCHC: 34.9 g/dL (ref 30.0–36.0)
MCV: 87 fL (ref 78.0–100.0)
MONO ABS: 0.7 10*3/uL (ref 0.1–1.0)
Monocytes Relative: 9 % (ref 3–12)
Neutro Abs: 5.5 10*3/uL (ref 1.7–7.7)
Neutrophils Relative %: 71 % (ref 43–77)
Platelets: 279 10*3/uL (ref 150–400)
RBC: 4.91 MIL/uL (ref 4.22–5.81)
RDW: 15.7 % — AB (ref 11.5–15.5)
WBC: 7.8 10*3/uL (ref 4.0–10.5)

## 2014-02-17 LAB — HEMOGLOBIN A1C
HEMOGLOBIN A1C: 5.4 % (ref <5.7)
Mean Plasma Glucose: 108 mg/dL (ref <117)

## 2014-02-17 NOTE — Patient Instructions (Signed)

## 2014-02-18 LAB — LIPID PANEL
Cholesterol: 129 mg/dL (ref 0–200)
HDL: 44 mg/dL (ref >39)
LDL CALC: 68 mg/dL (ref 0–99)
Total CHOL/HDL Ratio: 2.9 Ratio
Triglycerides: 86 mg/dL (ref <150)
VLDL: 17 mg/dL (ref 0–40)

## 2014-02-18 LAB — BASIC METABOLIC PANEL WITH GFR
BUN: 17 mg/dL (ref 6–23)
CHLORIDE: 103 meq/L (ref 96–112)
CO2: 30 mEq/L (ref 19–32)
CREATININE: 1.01 mg/dL (ref 0.50–1.35)
Calcium: 8.9 mg/dL (ref 8.4–10.5)
GFR, EST NON AFRICAN AMERICAN: 71 mL/min
GFR, Est African American: 82 mL/min
Glucose, Bld: 106 mg/dL — ABNORMAL HIGH (ref 70–99)
Potassium: 4 mEq/L (ref 3.5–5.3)
Sodium: 140 mEq/L (ref 135–145)

## 2014-02-18 LAB — HEPATIC FUNCTION PANEL
ALBUMIN: 4.3 g/dL (ref 3.5–5.2)
ALT: 15 U/L (ref 0–53)
AST: 16 U/L (ref 0–37)
Alkaline Phosphatase: 57 U/L (ref 39–117)
Bilirubin, Direct: 0.2 mg/dL (ref 0.0–0.3)
Indirect Bilirubin: 0.7 mg/dL (ref 0.2–1.2)
Total Bilirubin: 0.9 mg/dL (ref 0.2–1.2)
Total Protein: 6.6 g/dL (ref 6.0–8.3)

## 2014-02-18 LAB — TSH: TSH: 3.227 u[IU]/mL (ref 0.350–4.500)

## 2014-02-18 LAB — MAGNESIUM: Magnesium: 2.1 mg/dL (ref 1.5–2.5)

## 2014-02-18 LAB — VITAMIN D 25 HYDROXY (VIT D DEFICIENCY, FRACTURES): Vit D, 25-Hydroxy: 72 ng/mL (ref 30–89)

## 2014-02-18 LAB — INSULIN, FASTING: INSULIN FASTING, SERUM: 18.6 u[IU]/mL (ref 2.0–19.6)

## 2014-03-29 ENCOUNTER — Other Ambulatory Visit: Payer: Self-pay | Admitting: *Deleted

## 2014-03-29 MED ORDER — BISOPROLOL-HYDROCHLOROTHIAZIDE 10-6.25 MG PO TABS: ORAL_TABLET | ORAL | Status: DC

## 2014-04-12 ENCOUNTER — Other Ambulatory Visit: Payer: Self-pay | Admitting: Internal Medicine

## 2014-04-27 ENCOUNTER — Other Ambulatory Visit: Payer: Self-pay | Admitting: *Deleted

## 2014-04-27 MED ORDER — FINASTERIDE 5 MG PO TABS: ORAL_TABLET | ORAL | Status: DC

## 2014-05-19 ENCOUNTER — Ambulatory Visit (INDEPENDENT_AMBULATORY_CARE_PROVIDER_SITE_OTHER): Payer: Medicare Other | Admitting: Internal Medicine

## 2014-05-19 ENCOUNTER — Encounter: Payer: Self-pay | Admitting: Internal Medicine

## 2014-05-19 VITALS — BP 136/84 | HR 60 | Temp 97.4°F | Resp 16 | Ht 72.0 in | Wt 231.2 lb

## 2014-05-19 DIAGNOSIS — H109 Unspecified conjunctivitis: Secondary | ICD-10-CM

## 2014-05-19 DIAGNOSIS — I10 Essential (primary) hypertension: Secondary | ICD-10-CM

## 2014-05-19 DIAGNOSIS — L820 Inflamed seborrheic keratosis: Secondary | ICD-10-CM

## 2014-05-19 DIAGNOSIS — D485 Neoplasm of uncertain behavior of skin: Secondary | ICD-10-CM

## 2014-05-19 MED ORDER — NEOMYCIN-POLYMYXIN-DEXAMETH 3.5-10000-0.1 OP SUSP: OPHTHALMIC | Status: DC

## 2014-05-19 NOTE — Progress Notes (Signed)
Subjective:    Patient ID: Kevin Ellison, male    DOB: 08-May-1934, 80 y.o.   MRN: [id number]  HPI  Patient presents for BP check and also has c/o of a recurrent red eye with crusty mucoid secretions and "matting" in the morning. Treied OTC boric acid rinses w/o success.  HT/CV systems review is negative. Also, has large inflammed Seb Keratosis in left temporal scalp which is pruritic and becomes inflamed with scratching. Similar smaller lesions on bilat posterior neck. Lastly has a recurrent crusting to ulcerating lesion of the left medial knee which is non healing for 4-6 months.   Medication Sig  . bisoprolol-hydrochlorothiazide (ZIAC) 10-6.25 MG per tablet Take one tablet by mouth every morning  for blood pressure  . finasteride (PROSCAR) 5 MG tablet TAKE 1 TABLET BY MOUTH DAILY FOR PROSTATE  . furosemide (LASIX) 40 MG tablet Take 1 tablet (40 mg total) by mouth daily. For BP and fluid  . levothyroxine (SYNTHROID, LEVOTHROID) 50 MCG tablet Take 1 tablet (50 mcg total) by mouth daily before breakfast.  . losartan (COZAAR) 100 MG tablet TAKE ONE TABLET BY MOUTH EVERY DAY FOR BLOOD PRESSURE   . meloxicam (MOBIC) 15 MG tablet Take 1 tablet by mouth daily after a meal for arthritis  . minoxidil (LONITEN) 10 MG tablet Take 1/4 to 1/2 to 1 tablet daily as directed for BP  . pravastatin (PRAVACHOL) 40 MG tablet Take one tablet by mouth at bedtime for cholesterol  . tamsulosin (FLOMAX) 0.4 MG CAPS capsule TAKE ONE CAPSULE BY MOUTH ONE TIME DAILY    Allergies  Allergen Reactions  . Ace Inhibitors     cough  . Cardura [Doxazosin [place]    Past Medical History  Diagnosis Date  . Hyperlipidemia   . Hypertension   . Thyroid disease   . Prediabetes   . GERD (gastroesophageal reflux disease)    Past Surgical History  Procedure Laterality Date  . Cystoscopy  2002   Review of Systems    In addition to the HPI above,  No Fever-chills,  No Headache, No changes with Vision or hearing,      Objective:   Physical Exam BP 136/84   Pulse 60  Temp 97.4 F   Resp 16  Ht 6'   Wt 231 lb 3.2 oz   BMI 31.35   HEENT - Eac's patent. TM's Nl. EOM's full. PERRLA. NasoOroPharynx clear. Rt conjunctiva  2+ injected with a mucopurulent discharge and crosty lid margins. Lt eye Nl. Neck - supple. Nl Thyroid. Carotids 2+ & No bruits, nodes, JVD Chest - Clear equal BS w/o Rales, rhonchi, wheezes. Cor - Nl HS. RRR w/o sig MGR. PP 1(+). No edema. Abd - No palpable organomegaly, masses or tenderness. BS nl. MS- FROM w/o deformities. Muscle power, tone and bulk Nl. Gait Nl. Neuro - No obvious Cr N abnormalities. Sensory, motor and Cerebellar functions appear Nl w/o focal abnormalities. Psyche - Mental status normal & appropriate.  No delusions, ideations or obvious mood abnormalities.  Skin: After informed consent Treatments as below.   (1) 8 x 12 mm superficially ulcerated area medial lt knee (appearing to be a late stage solar or actinic keratoses) treated w/ liq Nit by triple freeze-thaw technique x 3 and occlusive Tegaderm dsg applied.  (2) Large 18 x 20 mm raised brown crusty lesion in left temporal scalp (appearing to be a seborrheic keratosis) with an irritated periphery treated likewise w/ liq Nit also by triple-freeze thaw technique and  (  3) similar lesions smaller in size  ~ 3 x 3 mm & 4 x 4 mm  (x2) of Rt posterior neck and 5 x 6 mm (x 1) Lt post neck - all treated in fashion similar to above and patient tolerated well. Post op care advised      Assessment & Plan:    1. Essential hypertension  - controlled  2. Conjunctivitis of right eye  -Rx-  Maxitrol Opth susp  3. Neoplasm of uncertain behavior of skin - Suspect Actinic Keratosis  - Procedure 17000 - Cryosurg  4. Seborrheic keratoses, inflamed  X 4   - Procedure 17003, 43276, 14709, 17003 - Cryosurg x 4

## 2014-05-26 ENCOUNTER — Encounter: Payer: Self-pay | Admitting: Internal Medicine

## 2014-05-26 ENCOUNTER — Ambulatory Visit (INDEPENDENT_AMBULATORY_CARE_PROVIDER_SITE_OTHER): Payer: Medicare Other | Admitting: Internal Medicine

## 2014-05-26 VITALS — BP 114/72 | HR 56 | Temp 98.2°F | Resp 16 | Ht 72.0 in | Wt 227.2 lb

## 2014-05-26 DIAGNOSIS — I10 Essential (primary) hypertension: Secondary | ICD-10-CM

## 2014-05-26 DIAGNOSIS — R7309 Other abnormal glucose: Secondary | ICD-10-CM

## 2014-05-26 DIAGNOSIS — E782 Mixed hyperlipidemia: Secondary | ICD-10-CM

## 2014-05-26 DIAGNOSIS — E039 Hypothyroidism, unspecified: Secondary | ICD-10-CM

## 2014-05-26 DIAGNOSIS — E559 Vitamin D deficiency, unspecified: Secondary | ICD-10-CM

## 2014-05-26 DIAGNOSIS — Z79899 Other long term (current) drug therapy: Secondary | ICD-10-CM

## 2014-05-26 DIAGNOSIS — R7303 Prediabetes: Secondary | ICD-10-CM

## 2014-05-26 LAB — CBC WITH DIFFERENTIAL/PLATELET
Basophils Absolute: 0 10*3/uL (ref 0.0–0.1)
Basophils Relative: 0 % (ref 0–1)
EOS ABS: 0.2 10*3/uL (ref 0.0–0.7)
Eosinophils Relative: 2 % (ref 0–5)
HCT: 42.4 % (ref 39.0–52.0)
HEMOGLOBIN: 14.8 g/dL (ref 13.0–17.0)
LYMPHS ABS: 1.6 10*3/uL (ref 0.7–4.0)
Lymphocytes Relative: 14 % (ref 12–46)
MCH: 30.2 pg (ref 26.0–34.0)
MCHC: 34.9 g/dL (ref 30.0–36.0)
MCV: 86.5 fL (ref 78.0–100.0)
MPV: 9.8 fL (ref 9.4–12.4)
Monocytes Absolute: 0.9 10*3/uL (ref 0.1–1.0)
Monocytes Relative: 8 % (ref 3–12)
NEUTROS ABS: 8.9 10*3/uL — AB (ref 1.7–7.7)
Neutrophils Relative %: 76 % (ref 43–77)
Platelets: 272 10*3/uL (ref 150–400)
RBC: 4.9 MIL/uL (ref 4.22–5.81)
RDW: 15.8 % — ABNORMAL HIGH (ref 11.5–15.5)
WBC: 11.7 10*3/uL — AB (ref 4.0–10.5)

## 2014-05-26 NOTE — Patient Instructions (Signed)

## 2014-05-26 NOTE — Progress Notes (Signed)
Patient ID: Kevin Ellison, male   DOB: December 03, 1933, 80 y.o.   MRN: [id number]   This very nice 80 y.o.MWM presents for 3 month follow up with Hypertension, Hyperlipidemia, Pre-Diabetes and Vitamin D Deficiency. Patient had cryosurg last week to SK of the left temple right & left neck and to the left medial knee .    Patient is treated for HTN & BP has been controlled at home. Today's BP: 114/72 mmHg. Patient has had no complaints of any cardiac type chest pain, palpitations, dyspnea/orthopnea/PND, dizziness, claudication, or dependent edema.   Hyperlipidemia is controlled with diet & meds. Patient denies myalgias or other med SE's. Last Lipids were at goal - Total Chol 129; HDL  44; LDL  68; Trig 86 on 02/17/2014.   Also, the patient has history of PreDiabetes and has had no symptoms of reactive hypoglycemia, diabetic polys, paresthesias or visual blurring.  Last A1c was  5.4% on  02/17/2014.   Further, the patient also has history of Vitamin D Deficiency and supplements vitamin D without any suspected side-effects. Last vitamin D was 72 on  02/17/2014.   Medication List   finasteride 5 MG tablet  Commonly known as:  PROSCAR  TAKE 1 TABLET BY MOUTH DAILY FOR PROSTATE     furosemide 40 MG tablet  Commonly known as:  LASIX  Take 1 tablet (40 mg total) by mouth daily. For BP and fluid     levothyroxine 50 MCG tablet  Commonly known as:  SYNTHROID, LEVOTHROID  Take 1 tablet (50 mcg total) by mouth daily before breakfast.     losartan 100 MG tablet  Commonly known as:  COZAAR  TAKE ONE TABLET BY MOUTH EVERY DAY FOR BLOOD PRESSURE     meloxicam 15 MG tablet  Commonly known as:  MOBIC  Take 1 tablet by mouth daily after a meal for arthritis     minoxidil 10 MG tablet  Commonly known as:  LONITEN  Take 1/4 to 1/2 to 1 tablet daily as directed for BP     neomycin-polymyxin b-dexamethasone 3.5-10000-0.1 Susp  Commonly known as:  MAXITROL  1-2 drops to affected eye every 1 - 2 hours today, then 4 x  day     pravastatin 40 MG tablet  Commonly known as:  PRAVACHOL  Take one tablet by mouth at bedtime for cholesterol     tamsulosin 0.4 MG Caps capsule  Commonly known as:  FLOMAX  TAKE ONE CAPSULE BY MOUTH ONE TIME DAILY     Allergies  Allergen Reactions  . Ace Inhibitors     cough  . Cardura [Doxazosin [place]]    PMHx:   Past Medical History  Diagnosis Date  . Hyperlipidemia   . Hypertension   . Thyroid disease   . Prediabetes   . GERD (gastroesophageal reflux disease)    Immunization History  Administered Date(s) Administered  . Influenza Split 03/09/2013  . Influenza, High Dose Seasonal PF 02/17/2014  . Pneumococcal Conjugate-13 02/17/2014  . Td 10/24/2006  . Zoster 05/17/2009   Past Surgical History  Procedure Laterality Date  . Cystoscopy  2002   FHx:    Reviewed / unchanged  SHx:    Reviewed / unchanged  Systems Review:  Constitutional: Denies fever, chills, wt changes, headaches, insomnia, fatigue, night sweats, change in appetite. Eyes: Denies redness, blurred vision, diplopia, discharge, itchy, watery eyes.  ENT: Denies discharge, congestion, post nasal drip, epistaxis, sore throat, earache, hearing loss, dental pain, tinnitus, vertigo, sinus pain, snoring.  CV: Denies chest pain, palpitations, irregular heartbeat, syncope, dyspnea, diaphoresis, orthopnea, PND, claudication or edema. Respiratory: denies cough, dyspnea, DOE, pleurisy, hoarseness, laryngitis, wheezing.  Gastrointestinal: Denies dysphagia, odynophagia, heartburn, reflux, water brash, abdominal pain or cramps, nausea, vomiting, bloating, diarrhea, constipation, hematemesis, melena, hematochezia  or hemorrhoids. Genitourinary: Denies dysuria, frequency, urgency, nocturia, hesitancy, discharge, hematuria or flank pain. Musculoskeletal: Denies arthralgias, myalgias, stiffness, jt. swelling, pain, limping or strain/sprain.  Skin: Denies pruritus, rash, hives, warts, acne, eczema or change in  skin lesion(s). Neuro: No weakness, tremor, incoordination, spasms, paresthesia or pain. Psychiatric: Denies confusion, memory loss or sensory loss. Endo: Denies change in weight, skin or hair change.  Heme/Lymph: No excessive bleeding, bruising or enlarged lymph nodes.  Physical Exam  BP 114/72 mmHg  Pulse 56  Temp(Src) 98.2 F (36.8 C)  Resp 16  Ht 6' (1.829 m)  Wt 227 lb 3.2 oz (103.057 kg)  BMI 30.81 kg/m2  Appears well nourished and in no distress. Eyes: PERRLA, EOMs, conjunctiva no swelling or erythema. Sinuses: No frontal/maxillary tenderness ENT/Mouth: EAC's clear, TM's nl w/o erythema, bulging. Nares clear w/o erythema, swelling, exudates. Oropharynx clear without erythema or exudates. Oral hygiene is good. Tongue normal, non obstructing. Hearing intact.  Neck: Supple. Thyroid nl. Car 2+/2+ without bruits, nodes or JVD. Chest: Respirations nl with BS clear & equal w/o rales, rhonchi, wheezing or stridor.  Cor: Heart sounds normal w/ regular rate and rhythm without sig. murmurs, gallops, clicks, or rubs. Peripheral pulses normal and equal  without edema.  Abdomen: Soft & bowel sounds normal. Non-tender w/o guarding, rebound, hernias, masses, or organomegaly.  Lymphatics: Unremarkable.  Musculoskeletal: Full ROM all peripheral extremities, joint stability, 5/5 strength, and normal gait.  Skin: Warm, dry without exposed rashes, lesions or ecchymosis apparent.  Neuro: Cranial nerves intact, reflexes equal bilaterally. Sensory-motor testing grossly intact. Tendon reflexes grossly intact.  Pysch: Alert & oriented x 3.  Insight and judgement nl & appropriate. No ideations.  Assessment and Plan:  1. Hypertension - Continue monitor blood pressure at home. Continue diet/meds same.  2. Hyperlipidemia - Continue diet/meds, exercise,& lifestyle modifications. Continue monitor periodic cholesterol/liver & renal functions   3.  Pre-Diabetes - Continue diet, exercise, lifestyle  modifications. Monitor appropriate labs.  4. Vitamin D Deficiency - Continue supplementation.   Recommended regular exercise, BP monitoring, weight control, and discussed med and SE's. Recommended labs to assess and monitor clinical status. Further disposition pending results of labs.

## 2014-05-27 LAB — BASIC METABOLIC PANEL WITHOUT GFR
BUN: 17 mg/dL (ref 6–23)
CO2: 32 meq/L (ref 19–32)
Calcium: 8.6 mg/dL (ref 8.4–10.5)
Chloride: 102 meq/L (ref 96–112)
Creat: 0.85 mg/dL (ref 0.50–1.35)
GFR, Est African American: >89 mL/min
GFR, Est Non African American: 83 mL/min
Glucose, Bld: 124 mg/dL — ABNORMAL HIGH (ref 70–99)
Potassium: 4.1 meq/L (ref 3.5–5.3)
Sodium: 140 meq/L (ref 135–145)

## 2014-05-27 LAB — LIPID PANEL
Cholesterol: 124 mg/dL (ref 0–200)
HDL: 41 mg/dL (ref >39)
LDL CALC: 66 mg/dL (ref 0–99)
Total CHOL/HDL Ratio: 3 Ratio
Triglycerides: 87 mg/dL (ref <150)
VLDL: 17 mg/dL (ref 0–40)

## 2014-05-27 LAB — HEPATIC FUNCTION PANEL
ALK PHOS: 63 U/L (ref 39–117)
ALT: 13 U/L (ref 0–53)
AST: 16 U/L (ref 0–37)
Albumin: 4.2 g/dL (ref 3.5–5.2)
Bilirubin, Direct: 0.3 mg/dL (ref 0.0–0.3)
Indirect Bilirubin: 0.8 mg/dL (ref 0.2–1.2)
TOTAL PROTEIN: 6.5 g/dL (ref 6.0–8.3)
Total Bilirubin: 1.1 mg/dL (ref 0.2–1.2)

## 2014-05-27 LAB — HEMOGLOBIN A1C
Hgb A1c MFr Bld: 5.4 %
Mean Plasma Glucose: 108 mg/dL

## 2014-05-27 LAB — INSULIN, FASTING: Insulin fasting, serum: 13.8 u[IU]/mL (ref 2.0–19.6)

## 2014-05-27 LAB — VITAMIN D 25 HYDROXY (VIT D DEFICIENCY, FRACTURES): Vit D, 25-Hydroxy: 67 ng/mL (ref 30–100)

## 2014-05-27 LAB — TSH: TSH: 3.561 u[IU]/mL (ref 0.350–4.500)

## 2014-05-27 LAB — MAGNESIUM: Magnesium: 2.1 mg/dL (ref 1.5–2.5)

## 2014-07-12 ENCOUNTER — Ambulatory Visit (INDEPENDENT_AMBULATORY_CARE_PROVIDER_SITE_OTHER): Payer: Medicare Other | Admitting: Internal Medicine

## 2014-07-12 ENCOUNTER — Encounter: Payer: Self-pay | Admitting: Internal Medicine

## 2014-07-12 VITALS — BP 142/74 | HR 60 | Temp 98.6°F | Resp 16 | Ht 72.0 in | Wt 230.4 lb

## 2014-07-12 DIAGNOSIS — L820 Inflamed seborrheic keratosis: Secondary | ICD-10-CM

## 2014-07-12 DIAGNOSIS — I10 Essential (primary) hypertension: Secondary | ICD-10-CM

## 2014-07-12 MED ORDER — BETAMETHASONE DIPROPIONATE 0.05 % EX OINT: TOPICAL_OINTMENT | Freq: Two times a day (BID) | CUTANEOUS | Status: DC

## 2014-07-12 NOTE — Progress Notes (Signed)
   Subjective:    Patient ID: Kevin Ellison, male    DOB: [birth year], 81 y.o.   MRN: [id number]  HPI Patient with HTN, HLD, GERD, PreDM, Vit D Def presents for f/u and noting sl elevation of BP  142/74  today. CV systems review is negative. Alo for recheck of several areas of suspected AK treated by cryosurg recently which all apparently healed well, but also has several large thickened inflamed seb keratoses which also he would like treated.   Medication Sig  . bisoprolol-hctz 10-6.25 MG per tablet Take one tablet by mouth every morning  for blood pressure  . finasteride  5 MG tablet TAKE 1 TABLET BY MOUTH DAILY FOR PROSTATE  . levothyroxine  50 MCG tablet Take 1 tablet (50 mcg total) by mouth daily before breakfast.  . losartan 100 MG tablet TAKE ONE TABLET BY MOUTH EVERY DAY FOR BLOOD PRESSURE   . meloxicam  15 MG tablet Take 1 tablet by mouth daily after a meal for arthritis  . minoxidil 10 MG tablet Take 1/4 to 1/2 to 1 tablet daily as directed for BP  . pravastatin  40 MG tablet Take one tablet by mouth at bedtime for cholesterol  . tamsulosin 0.4 MG CAPS capsule TAKE ONE CAPSULE BY MOUTH ONE TIME DAILY   . furosemide  40 MG tablet Take 1 tablet (40 mg total) by mouth daily. For BP and fluid  . MAXITROL  SUSP 1-2 drops to affected eye every 1 - 2 hours today, then 4 x day   Allergies  Allergen Reactions  . Ace Inhibitors     cough  . Cardura [Doxazosin [place]    Past Medical History  Diagnosis Date  . Hyperlipidemia   . Hypertension   . Thyroid disease   . Prediabetes   . GERD (gastroesophageal reflux disease)    Review of Systems 10 pt review negative except as above.    Objective:   Physical Exam BP 142/74 mmHg  Pulse 60  Temp(Src) 98.6 F (37 C)  Resp 16  Ht 6' (1.829 m)  Wt 230 lb 6.4 oz (104.509 kg)  BMI 31.24 kg/m2  HEENT - Eac's patent. TM's Nl. EOM's full. PERRLA. NasoOroPharynx clear. Neck - supple. Nl Thyroid. Carotids 2+ & No bruits, nodes, JVD Chest -  Clear equal BS w/o Rales, rhonchi, wheezes. Cor - Nl HS. RRR w/o sig MGR. PP 1(+). No edema. Abd - No palpable organomegaly, masses or tenderness. BS nl. MS- FROM w/o deformities. Muscle power, tone and bulk Nl. Gait Nl. Neuro - No obvious Cr N abnormalities. Sensory, motor and Cerebellar functions appear Nl w/o focal abnormalities. Psyche - Mental status normal & appropriate.  No delusions, ideations or obvious mood abnormalities. Skin : Large 2 x 1.75 inflamed thickened Seb Keratoses left temple treated with Liq Nitrogen by triple freeze Everlean Patterson technique. 2 smaller areas 10x10 mm of the left anterior sideburn area treated in a similar manner. Lastly a 25 x 25 mm area inferior to the right sideburn area also was treated likewise in a similar fashion and patient tolerated the tx's w/o difficulty. Patient was instructed in PO care.     Assessment & Plan:   1. Essential hypertension   2. Seborrheic keratoses, inflamed  - Procedure : 03500 & [id number]

## 2014-07-19 ENCOUNTER — Other Ambulatory Visit: Payer: Self-pay | Admitting: Internal Medicine

## 2014-08-12 ENCOUNTER — Other Ambulatory Visit: Payer: Self-pay | Admitting: Internal Medicine

## 2014-09-09 ENCOUNTER — Ambulatory Visit (INDEPENDENT_AMBULATORY_CARE_PROVIDER_SITE_OTHER): Payer: Medicare Other | Admitting: Internal Medicine

## 2014-09-09 ENCOUNTER — Encounter: Payer: Self-pay | Admitting: Internal Medicine

## 2014-09-09 VITALS — BP 136/82 | HR 52 | Temp 97.7°F | Resp 16 | Ht 72.0 in | Wt 233.8 lb

## 2014-09-09 DIAGNOSIS — Z125 Encounter for screening for malignant neoplasm of prostate: Secondary | ICD-10-CM | POA: Diagnosis not present

## 2014-09-09 DIAGNOSIS — E669 Obesity, unspecified: Secondary | ICD-10-CM

## 2014-09-09 DIAGNOSIS — E039 Hypothyroidism, unspecified: Secondary | ICD-10-CM | POA: Diagnosis not present

## 2014-09-09 DIAGNOSIS — R6889 Other general symptoms and signs: Secondary | ICD-10-CM

## 2014-09-09 DIAGNOSIS — E782 Mixed hyperlipidemia: Secondary | ICD-10-CM

## 2014-09-09 DIAGNOSIS — E559 Vitamin D deficiency, unspecified: Secondary | ICD-10-CM | POA: Diagnosis not present

## 2014-09-09 DIAGNOSIS — Z79899 Other long term (current) drug therapy: Secondary | ICD-10-CM

## 2014-09-09 DIAGNOSIS — I10 Essential (primary) hypertension: Secondary | ICD-10-CM | POA: Diagnosis not present

## 2014-09-09 DIAGNOSIS — Z0001 Encounter for general adult medical examination with abnormal findings: Secondary | ICD-10-CM

## 2014-09-09 DIAGNOSIS — K2100 Gastro-esophageal reflux disease with esophagitis, without bleeding: Secondary | ICD-10-CM

## 2014-09-09 DIAGNOSIS — Z9181 History of falling: Secondary | ICD-10-CM

## 2014-09-09 DIAGNOSIS — R7309 Other abnormal glucose: Secondary | ICD-10-CM | POA: Diagnosis not present

## 2014-09-09 DIAGNOSIS — Z1331 Encounter for screening for depression: Secondary | ICD-10-CM

## 2014-09-09 DIAGNOSIS — M159 Polyosteoarthritis, unspecified: Secondary | ICD-10-CM

## 2014-09-09 DIAGNOSIS — Z1212 Encounter for screening for malignant neoplasm of rectum: Secondary | ICD-10-CM

## 2014-09-09 DIAGNOSIS — R7303 Prediabetes: Secondary | ICD-10-CM

## 2014-09-09 DIAGNOSIS — Z0000 Encounter for general adult medical examination without abnormal findings: Secondary | ICD-10-CM

## 2014-09-09 LAB — CBC WITH DIFFERENTIAL/PLATELET
Basophils Absolute: 0.1 10*3/uL (ref 0.0–0.1)
Basophils Relative: 1 % (ref 0–1)
EOS PCT: 2 % (ref 0–5)
Eosinophils Absolute: 0.2 10*3/uL (ref 0.0–0.7)
HCT: 41.4 % (ref 39.0–52.0)
Hemoglobin: 14.4 g/dL (ref 13.0–17.0)
LYMPHS ABS: 1.4 10*3/uL (ref 0.7–4.0)
Lymphocytes Relative: 19 % (ref 12–46)
MCH: 30.5 pg (ref 26.0–34.0)
MCHC: 34.8 g/dL (ref 30.0–36.0)
MCV: 87.7 fL (ref 78.0–100.0)
MONO ABS: 0.8 10*3/uL (ref 0.1–1.0)
MONOS PCT: 10 % (ref 3–12)
MPV: 10 fL (ref 8.6–12.4)
Neutro Abs: 5.2 10*3/uL (ref 1.7–7.7)
Neutrophils Relative %: 68 % (ref 43–77)
Platelets: 261 10*3/uL (ref 150–400)
RBC: 4.72 MIL/uL (ref 4.22–5.81)
RDW: 15.5 % (ref 11.5–15.5)
WBC: 7.6 10*3/uL (ref 4.0–10.5)

## 2014-09-09 LAB — LIPID PANEL
CHOL/HDL RATIO: 3.8 ratio
Cholesterol: 128 mg/dL (ref 0–200)
HDL: 34 mg/dL — AB (ref >=40)
LDL CALC: 79 mg/dL (ref 0–99)
Triglycerides: 77 mg/dL (ref <150)
VLDL: 15 mg/dL (ref 0–40)

## 2014-09-09 LAB — BASIC METABOLIC PANEL WITH GFR
BUN: 14 mg/dL (ref 6–23)
CALCIUM: 8.4 mg/dL (ref 8.4–10.5)
CO2: 31 mEq/L (ref 19–32)
Chloride: 102 mEq/L (ref 96–112)
Creat: 0.89 mg/dL (ref 0.50–1.35)
GFR, Est African American: >89 mL/min
GFR, Est Non African American: 82 mL/min
GLUCOSE: 110 mg/dL — AB (ref 70–99)
POTASSIUM: 4.3 meq/L (ref 3.5–5.3)
SODIUM: 140 meq/L (ref 135–145)

## 2014-09-09 LAB — HEMOGLOBIN A1C
Hgb A1c MFr Bld: 5.9 % — ABNORMAL HIGH (ref <5.7)
Mean Plasma Glucose: 123 mg/dL — ABNORMAL HIGH (ref <117)

## 2014-09-09 LAB — HEPATIC FUNCTION PANEL
ALBUMIN: 4 g/dL (ref 3.5–5.2)
ALT: 19 U/L (ref 0–53)
AST: 19 U/L (ref 0–37)
Alkaline Phosphatase: 56 U/L (ref 39–117)
BILIRUBIN DIRECT: 0.2 mg/dL (ref 0.0–0.3)
BILIRUBIN INDIRECT: 0.7 mg/dL (ref 0.2–1.2)
Total Bilirubin: 0.9 mg/dL (ref 0.2–1.2)
Total Protein: 6.3 g/dL (ref 6.0–8.3)

## 2014-09-09 LAB — INSULIN, RANDOM: Insulin: 11.3 u[IU]/mL (ref 2.0–19.6)

## 2014-09-09 LAB — MAGNESIUM: MAGNESIUM: 2.3 mg/dL (ref 1.5–2.5)

## 2014-09-09 LAB — TSH: TSH: 3.031 u[IU]/mL (ref 0.350–4.500)

## 2014-09-09 NOTE — Progress Notes (Signed)
Patient ID: Kevin Ellison, male   DOB: [birth year], 81 y.o.   MRN: [id number]  [facility] VISIT AND CPE  Assessment:   1. Essential hypertension  - Microalbumin / creatinine urine ratio - EKG 12-Lead - [place], RETROPERITNL ABD,  LTD  2. Hyperlipidemia  - Lipid panel  3. Prediabetes  - Hemoglobin A1c - Insulin, random  4. Vitamin D deficiency  - Vit D  25 hydroxy (rtn osteoporosis monitoring)  5. Hypothyroidism, unspecified hypothyroidism type  - TSH  6. GERD   7. Obesity (BMI 30-39.9)   8. DJD   9. Depression screen  10. Screening for rectal cancer  - POC Hemoccult Bld/Stl (3-Cd Home Screen); Future  11. Prostate cancer screening  - PSA  12. At low risk for fall   13. Routine general medical examination at a health care facility   14. Medication management  - Urine Microscopic - CBC with Differential/Platelet - BASIC METABOLIC PANEL WITH GFR - Hepatic function panel - Magnesium  Plan:   During the course of the visit the patient was educated and counseled about appropriate screening and preventive services including:    Pneumococcal vaccine   Influenza vaccine  Td vaccine  Screening electrocardiogram  Bone densitometry screening  Colorectal cancer screening  Diabetes screening  Glaucoma screening  Nutrition counseling   Advanced directives: requested  Screening recommendations, referrals: Vaccinations: Immunization History  Administered Date(s) Administered  . Influenza Split 03/09/2013  . Influenza, High Dose Seasonal PF 02/17/2014  . Pneumococcal Conjugate-13 02/17/2014  . Td 10/24/2006  . Zoster 05/17/2009  Pneumococcal vaccine 2005 Hep B vaccine not indicated  Nutrition assessed and recommended  Colonoscopy 2006 and schedulig f/u Recommended yearly ophthalmology/optometry visit for glaucoma screening and checkup Recommended yearly dental visit for hygiene and checkup Advanced directives -  yes  Conditions/risks identified: BMI: Discussed weight loss, diet, and increase physical activity.  Increase physical activity: AHA recommends 150 minutes of physical activity a week.  Medications reviewed PreDiabetes is at goal, ACE/ARB therapy: Yes. Urinary Incontinence is not an issue: discussed non pharmacology and pharmacology options.  Fall risk: low- discussed PT, home fall assessment, medications.   Subjective:    Kevin Ellison  presents for [facility] Visit and complete physical.  Date of last medicare wellness visit was 02/17/2014. This very nice 81 y.o. MWM presents  with Hypertension, Hyperlipidemia, Pre-Diabetes and Vitamin D Deficiency.    Patient is treated for HTN  since 1994 & BP has been controlled at home. Today's BP: 136/82 mmHg. Patient has had no complaints of any cardiac type chest pain, palpitations, dyspnea/orthopnea/PND, dizziness, claudication, or dependent edema.   Hyperlipidemia is controlled with diet & meds. Patient denies myalgias or other med SE's. Last Lipids were at goal - Total Chol 124; HDL 41; LDL  66; Trig 87 on 05/26/2014.   Also, the patient has history of PreDiabetes since 2012 with A1c 5.7% and has had no symptoms of reactive hypoglycemia, diabetic polys, paresthesias or visual blurring.  Last A1c was  5.4% on 05/26/2014.   Patient has been on thyroid replacement - compensated in the normal range. Further, the patient also has history of Vitamin D Deficiency of 44 in 2008  and supplements vitamin D without any suspected side-effects. Last vitamin D was 67 on  05/26/2014.  Names of Other Physician/Practitioners you currently use: 1. [facility] here for primary care 2. Majel Homer, eye doctor, last visit ~ 10 yrs ago - encouraged patient eye exam  in near future 3. Dr ? DDS in [place] , dentist, last visit 2015  Patient Care Team: Unk Pinto, MD as PCP - General (Internal Medicine) Wynona Neat.  Shana Chute, MD as Referring Physician (Gastroenterology)  Medication Review: Medication Sig  . DIPROLENE 0.05 % ointment Apply topically 2 (two) times daily.  . bisoprolol-hctz [facility]) 10-6.25  Take one tablet by mouth every morning  for blood pressure  . finasteride  5 MG tablet TAKE 1 TABLET BY MOUTH DAILY FOR PROSTATE  . furosemide  40 MG tablet take 1 tablet by mouth daily for blood pressure and fluid  . levothyroxine  50 MCG tablet Take 1 tablet (50 mcg total) by mouth daily before breakfast.  . losartan  100 MG tablet TAKE ONE TABLET BY MOUTH EVERY DAY FOR BLOOD PRESSURE   . meloxicam 15 MG tablet Take 1 tablet by mouth daily after a meal for arthritis  . minoxidil  10 MG tablet Take 1/4 to 1/2 to 1 tablet daily as directed for BP  . pravastatin  40 MG tablet Take one tablet by mouth at bedtime for cholesterol  . tamsulosin  0.4 MG CAPS capsule TAKE ONE CAPSULE BY MOUTH ONE TIME DAILY    Current Problems (verified) Patient Active Problem List   Diagnosis Date Noted  . Medication management 08/11/2013  . Prediabetes   . GERD   . Essential hypertension 05/12/2013  . Hyperlipidemia 05/12/2013  . Vitamin D deficiency 05/12/2013  . DJD 05/12/2013  . Hypothyroidism 05/12/2013  . Obesity (BMI 30-39.9) 05/12/2013   Screening Tests Health Maintenance  Topic Date Due  . COLONOSCOPY  12/15/1985  . INFLUENZA VACCINE  01/10/2015  . PNA vac Low Risk Adult (2 of 2 - PPSV23) 02/18/2015  . TETANUS/TDAP  10/23/2016  . ZOSTAVAX  Completed   Immunization History  Administered Date(s) Administered  . Influenza Split 03/09/2013  . Influenza, High Dose Seasonal PF 02/17/2014  . Pneumococcal Conjugate-13 02/17/2014  . Td 10/24/2006  . Zoster 05/17/2009   Preventative care: Last colonoscopy: 2006 & is currently scheduling with Dr Maris Berger office in [facility] History reviewed: allergies, current medications, past family history, past medical history, past social history, past surgical  history and problem list  Risk Factors: Tobacco History  Substance Use Topics  . Smoking status: Former Research scientist (life sciences)  . Smokeless tobacco: Former Systems developer    Quit date: 06/12/1987  . Alcohol Use: 1.0 oz/week    2 drink(s) per week     Comment: occasional beer  14 beers a week   He does not smoke.  Patient is a former smoker. Are there smokers in your home (other than you)?  No  Alcohol Current alcohol use: 4 beers per day(s)  Caffeine Current caffeine use: coffee 1 cup /day  Exercise Current exercise: yard work and golfing  Nutrition/Diet Current diet: in general, a "healthy" diet    Cardiac risk factors: advanced age (older than 47 for men, 34 for women), dyslipidemia, hypertension, male gender, obesity (BMI >= 30 kg/m2), sedentary lifestyle and smoking/ tobacco exposure.  Depression Screen (Note: if answer to either of the following is "Yes", a more complete depression screening is indicated)   Q1: Over the past two weeks, have you felt down, depressed or hopeless? No  Q2: Over the past two weeks, have you felt little interest or pleasure in doing things? No  Have you lost interest or pleasure in daily life? No  Do you often feel hopeless? No  Do you cry  easily over simple problems? No  Activities of Daily Living In your present state of health, do you have any difficulty performing the following activities?:  Driving? No Managing money?  No Feeding yourself? No Getting from bed to chair? No Climbing a flight of stairs? No Preparing food and eating?: No Bathing or showering? No Getting dressed: No Getting to the toilet? No Using the toilet:No Moving around from place to place: No In the past year have you fallen or had a near fall?:No   Are you sexually active?  No  Do you have more than one partner?  No  Vision Difficulties: No  Hearing Difficulties: No Do you often ask people to speak up or repeat themselves? No Do you experience ringing or noises in your ears?  No Do you have difficulty understanding soft or whispered voices? No  Cognition  Do you feel that you have a problem with memory?No  Do you often misplace items? No  Do you feel safe at home?  Yes  Advanced directives Does patient have a [place]? Yes Does patient have a Living Will? Yes  Past Medical History  Diagnosis Date  . Hyperlipidemia   . Hypertension   . Thyroid disease   . Prediabetes   . GERD (gastroesophageal reflux disease)    Past Surgical History  Procedure Laterality Date  . Cystoscopy  2002   ROS: Constitutional: Denies fever, chills, weight loss/gain, headaches, insomnia, fatigue, night sweats or change in appetite. Eyes: Denies redness, blurred vision, diplopia, discharge, itchy or watery eyes.  ENT: Denies discharge, congestion, post nasal drip, epistaxis, sore throat, earache, hearing loss, dental pain, Tinnitus, Vertigo, Sinus pain or snoring.  Cardio: Denies chest pain, palpitations, irregular heartbeat, syncope, dyspnea, diaphoresis, orthopnea, PND, claudication or edema Respiratory: denies cough, dyspnea, DOE, pleurisy, hoarseness, laryngitis or wheezing.  Gastrointestinal: Denies dysphagia, heartburn, reflux, water brash, pain, cramps, nausea, vomiting, bloating, diarrhea, constipation, hematemesis, melena, hematochezia, jaundice or hemorrhoids Genitourinary: Denies dysuria, frequency, urgency, nocturia, hesitancy, discharge, hematuria or flank pain Musculoskeletal: Denies arthralgia, myalgia, stiffness, Jt. Swelling, pain, limp or strain/sprain. Denies Falls. Skin: Denies puritis, rash, hives, warts, acne, eczema or change in skin lesion Neuro: No weakness, tremor, incoordination, spasms, paresthesia or pain Psychiatric: Denies confusion, memory loss or sensory loss. Denies Depression. Endocrine: Denies change in weight, skin, hair change, nocturia, and paresthesia, diabetic polys, visual blurring or hyper / hypo glycemic episodes.   Heme/Lymph: No excessive bleeding, bruising or enlarged lymph nodes.  Objective:     BP 136/82   Pulse 52  Temp 97.7 F   Resp 16  Ht 6'   Wt 233 lb 12.8 oz    BMI 31.70   General Appearance:  Alert  WD/WN, male , in no apparent distress. Eyes: PERRLA, EOMs, conjunctiva no swelling or erythema, normal fundi and vessels. Sinuses: No frontal/maxillary tenderness ENT/Mouth: EACs patent / TMs  nl. Nares clear without erythema, swelling, mucoid exudates. Oral hygiene is good. No erythema, swelling, or exudate. Tongue normal, non-obstructing. Tonsils not swollen or erythematous. Hearing normal.  Neck: Supple, thyroid normal. No bruits, nodes or JVD. Respiratory: Respiratory effort normal.  BS equal and clear bilateral without rales, rhonci, wheezing or stridor. Cardio: Heart sounds are normal with regular rate and rhythm and no murmurs, rubs or gallops. Peripheral pulses are normal and equal bilaterally without edema. No aortic or femoral bruits. Chest: symmetric with normal excursions and percussion.  Abdomen: Flat, soft, with nl bowel sounds. Nontender, no guarding, rebound, hernias,  masses, or organomegaly.  Lymphatics: Non tender without lymphadenopathy.  Genitourinary: No hernias.Testes nl. DRE - prostate nl for age - smooth & firm w/o nodules. Neg Hemoccult. Musculoskeletal: Full ROM all peripheral extremities, joint stability, 5/5 strength, and normal gait. Skin: Warm and dry without rashes, lesions, cyanosis, clubbing or  ecchymosis.  Neuro: Cranial nerves intact, reflexes equal bilaterally. Normal muscle tone, no cerebellar symptoms. Sensation intact.  Pysch: Awake and oriented X 3 with normal affect, insight and judgment appropriate.   Cognitive Testing  Alert? Yes  Normal Appearance? Yes  Oriented to person? Yes  Place? Yes   Time? Yes  Recall of three objects?  Yes  Can perform simple calculations? Yes  Displays appropriate judgment? Yes  Can read the correct time from a  watch/clock? Yes  Medicare Attestation I have personally reviewed: The patient's medical and social history Their use of alcohol, tobacco or illicit drugs Their current medications and supplements The patient's functional ability including ADLs,fall risks, home safety risks, cognitive, and hearing and visual impairment Diet and physical activities Evidence for depression or mood disorders  The patient's weight, height, BMI, and visual acuity have been recorded in the chart.  I have made referrals, counseling, and provided education to the patient based on review of the above and I have provided the patient with a written personalized care plan for preventive services.  Over 40 minutes of exam, counseling, chart review was performed.  Panayiota Larkin DAVID, MD   09/09/2014

## 2014-09-09 NOTE — Patient Instructions (Signed)
Recommend the book "The END of DIETING" by Dr Excell Seltzer   & the book "The END of DIABETES " by Dr Excell Seltzer  At [email or web address] - get book & Audio CD's      Being diabetic has a  300% increased risk for heart attack, stroke, cancer, and alzheimer- type vascular dementia. It is very important that you work harder with diet by avoiding all foods that are white. Avoid white rice (brown & wild rice is OK), white potatoes (sweetpotatoes in moderation is OK), White bread or wheat bread or anything made out of white flour like bagels, donuts, rolls, buns, biscuits, cakes, pastries, cookies, pizza crust, and pasta (made from white flour & egg whites) - vegetarian pasta or spinach or wheat pasta is OK. Multigrain breads like Arnold's or [employer], or multigrain sandwich thins or flatbreads.  Diet, exercise and weight loss can reverse and cure diabetes in the early stages.  Diet, exercise and weight loss is very important in the control and prevention of complications of diabetes which affects every system in your body, ie. Brain - dementia/stroke, eyes - glaucoma/blindness, heart - heart attack/heart failure, kidneys - dialysis, stomach - gastric paralysis, intestines - malabsorption, nerves - severe painful neuritis, circulation - gangrene & loss of a leg(s), and finally cancer and Alzheimers.    I recommend avoid fried & greasy foods,  sweets/candy, white rice (brown or wild rice or Quinoa is OK), white potatoes (sweet potatoes are OK) - anything made from white flour - bagels, doughnuts, rolls, buns, biscuits,white and wheat breads, pizza crust and traditional pasta made of white flour & egg white(vegetarian pasta or spinach or wheat pasta is OK).  Multi-grain bread is OK - like multi-grain flat bread or sandwich thins. Avoid alcohol in excess. Exercise is also important.    Eat all the vegetables you want - avoid meat, especially red meat and dairy - especially cheese.  Cheese is the most  concentrated form of trans-fats which is the worst thing to clog up our arteries. Veggie cheese is OK which can be found in the fresh produce section at [employer] or [employer] or [employer]  Preventive Care for Adults A healthy lifestyle and preventive care can promote health and wellness. Preventive health guidelines for men include the following key practices:  A routine yearly physical is a good way to check with your health care provider about your health and preventative screening. It is a chance to share any concerns and updates on your health and to receive a thorough exam.  Visit your dentist for a routine exam and preventative care every 6 months. Brush your teeth twice a day and floss once a day. Good oral hygiene prevents tooth decay and gum disease.  The frequency of eye exams is based on your age, health, family medical history, use of contact lenses, and other factors. Follow your health care provider's recommendations for frequency of eye exams.  Eat a healthy diet. Foods such as vegetables, fruits, whole grains, low-fat dairy products, and lean protein foods contain the nutrients you need without too many calories. Decrease your intake of foods high in solid fats, added sugars, and salt. Eat the right amount of calories for you.Get information about a proper diet from your health care provider, if necessary.  Regular physical exercise is one of the most important things you can do for your health. Most adults should get at least 150 minutes of moderate-intensity exercise (any activity that increases your heart rate  and causes you to sweat) each week. In addition, most adults need muscle-strengthening exercises on 2 or more days a week.  Maintain a healthy weight. The body mass index (BMI) is a screening tool to identify possible weight problems. It provides an estimate of body fat based on height and weight. Your health care provider can find your BMI and can help you achieve or  maintain a healthy weight.For adults 20 years and older:  A BMI below 18.5 is considered underweight.  A BMI of 18.5 to 24.9 is normal.  A BMI of 25 to 29.9 is considered overweight.  A BMI of 30 and above is considered obese.  Maintain normal blood lipids and cholesterol levels by exercising and minimizing your intake of saturated fat. Eat a balanced diet with plenty of fruit and vegetables. Blood tests for lipids and cholesterol should begin at age 69 and be repeated every 5 years. If your lipid or cholesterol levels are high, you are over 50, or you are at high risk for heart disease, you may need your cholesterol levels checked more frequently.Ongoing high lipid and cholesterol levels should be treated with medicines if diet and exercise are not working.  If you smoke, find out from your health care provider how to quit. If you do not use tobacco, do not start.  Lung cancer screening is recommended for adults aged 5-80 years who are at high risk for developing lung cancer because of a history of smoking. A yearly low-dose CT scan of the lungs is recommended for people who have at least a 30-pack-year history of smoking and are a current smoker or have quit within the past 15 years. A pack year of smoking is smoking an average of 1 pack of cigarettes a day for 1 year (for example: 1 pack a day for 30 years or 2 packs a day for 15 years). Yearly screening should continue until the smoker has stopped smoking for at least 15 years. Yearly screening should be stopped for people who develop a health problem that would prevent them from having lung cancer treatment.  If you choose to drink alcohol, do not have more than 2 drinks per day. One drink is considered to be 12 ounces (355 mL) of beer, 5 ounces (148 mL) of wine, or 1.5 ounces (44 mL) of liquor.  Avoid use of street drugs. Do not share needles with anyone. Ask for help if you need support or instructions about stopping the use of  drugs.  High blood pressure causes heart disease and increases the risk of stroke. Your blood pressure should be checked at least every 1-2 years. Ongoing high blood pressure should be treated with medicines, if weight loss and exercise are not effective.  If you are 59-3 years old, ask your health care provider if you should take aspirin to prevent heart disease.  Diabetes screening involves taking a blood sample to check your fasting blood sugar level. Testing should be considered at a younger age or be carried out more frequently if you are overweight and have at least 1 risk factor for diabetes.  Colorectal cancer can be detected and often prevented. Most routine colorectal cancer screening begins at the age of 71 and continues through age 21. However, your health care provider may recommend screening at an earlier age if you have risk factors for colon cancer. On a yearly basis, your health care provider may provide home test kits to check for hidden blood in the stool. Use of  a small camera at the end of a tube to directly examine the colon (sigmoidoscopy or colonoscopy) can detect the earliest forms of colorectal cancer. Talk to your health care provider about this at age 93, when routine screening begins. Direct exam of the colon should be repeated every 5-10 years through age 85, unless early forms of precancerous polyps or small growths are found.  Hepatitis C blood testing is recommended for all people born from [birth year] through 1965 and any individual with known risks for hepatitis C.  Screening for abdominal aortic aneurysm (AAA)  by ultrasound is recommended for people who have history of high blood pressure or who are current or former smokers.  Healthy men should  receive prostate-specific antigen (PSA) blood tests as part of routine cancer screening. Talk with your health care provider about prostate cancer screening.  Testicular cancer screening is  recommended for adult males.  Screening includes self-exam, a health care provider exam, and other screening tests. Consult with your health care provider about any symptoms you have or any concerns you have about testicular cancer.  Use sunscreen. Apply sunscreen liberally and repeatedly throughout the day. You should seek shade when your shadow is shorter than you. Protect yourself by wearing long sleeves, pants, a wide-brimmed hat, and sunglasses year round, whenever you are outdoors.  Once a month, do a whole-body skin exam, using a mirror to look at the skin on your back. Tell your health care provider about new moles, moles that have irregular borders, moles that are larger than a pencil eraser, or moles that have changed in shape or color.  Stay current with required vaccines (immunizations).  Influenza vaccine. All adults should be immunized every year.  Tetanus, diphtheria, and acellular pertussis (Td, Tdap) vaccine. An adult who has not previously received Tdap or who does not know his vaccine status should receive 1 dose of Tdap. This initial dose should be followed by tetanus and diphtheria toxoids (Td) booster doses every 10 years. Adults with an unknown or incomplete history of completing a 3-dose immunization series with Td-containing vaccines should begin or complete a primary immunization series including a Tdap dose. Adults should receive a Td booster every 10 years.  Zoster vaccine. One dose is recommended for adults aged 26 years or older unless certain conditions are present.    PREVNAR - Pneumococcal 13-valent conjugate (PCV13) vaccine. When indicated, a person who is uncertain of his immunization history and has no record of immunization should receive the PCV13 vaccine. An adult aged 34 years or older who has certain medical conditions and has not been previously immunized should receive 1 dose of PCV13 vaccine. This PCV13 should be followed with a dose of pneumococcal polysaccharide (PPSV23) vaccine. The  PPSV23 vaccine dose should be obtained at least 8 weeks after the dose of PCV13 vaccine. An adult aged 12 years or older who has certain medical conditions and previously received 1 or more doses of PPSV23 vaccine should receive 1 dose of PCV13. The PCV13 vaccine dose should be obtained 1 or more years after the last PPSV23 vaccine dose.    PNEUMOVAX - Pneumococcal polysaccharide (PPSV23) vaccine. When PCV13 is also indicated, PCV13 should be obtained first. All adults aged 19 years and older should be immunized. An adult younger than age 64 years who has certain medical conditions should be immunized. Any person who resides in a nursing home or long-term care facility should be immunized. An adult smoker should be immunized. People with an immunocompromised condition  and certain other conditions should receive both PCV13 and PPSV23 vaccines. People with human immunodeficiency virus (HIV) infection should be immunized as soon as possible after diagnosis. Immunization during chemotherapy or radiation therapy should be avoided. Routine use of PPSV23 vaccine is not recommended for American Indians, [place] Natives, or people younger than 65 years unless there are medical conditions that require PPSV23 vaccine. When indicated, people who have unknown immunization and have no record of immunization should receive PPSV23 vaccine. One-time revaccination 5 years after the first dose of PPSV23 is recommended for people aged 19-64 years who have chronic kidney failure, nephrotic syndrome, asplenia, or immunocompromised conditions. People who received 1-2 doses of PPSV23 before age 49 years should receive another dose of PPSV23 vaccine at age 12 years or later if at least 5 years have passed since the previous dose. Doses of PPSV23 are not needed for people immunized with PPSV23 at or after age 59 years.    Hepatitis A vaccine. Adults who wish to be protected from this disease, have certain high-risk conditions, work  with hepatitis A-infected animals, work in hepatitis A research labs, or travel to or work in countries with a high rate of hepatitis A should be immunized. Adults who were previously unvaccinated and who anticipate close contact with an international adoptee during the first 60 days after arrival in the [place] from a country with a high rate of hepatitis A should be immunized.    Hepatitis B vaccine. Adults should be immunized if they wish to be protected from this disease, have certain high-risk conditions, may be exposed to blood or other infectious body fluids, are household contacts or sex partners of hepatitis B positive people, are clients or workers in certain care facilities, or travel to or work in countries with a high rate of hepatitis B.   Preventive Service / Frequency   Ages 11 and over  Blood pressure check.  Lipid and cholesterol check.  Lung cancer screening. / Every year if you are aged 34-80 years and have a 30-pack-year history of smoking and currently smoke or have quit within the past 15 years. Yearly screening is stopped once you have quit smoking for at least 15 years or develop a health problem that would prevent you from having lung cancer treatment.  Fecal occult blood test (FOBT) of stool. You may not have to do this test if you get a colonoscopy every 10 years.  Flexible sigmoidoscopy** or colonoscopy.** / Every 5 years for a flexible sigmoidoscopy or every 10 years for a colonoscopy beginning at age 42 and continuing until age 26.  Hepatitis C blood test.** / For all people born from [birth year] through 1963 and any individual with known risks for hepatitis C.  Abdominal aortic aneurysm (AAA) screening./ Screening current or former smokers or have Hypertension.  Skin self-exam. / Monthly.  Influenza vaccine. / Every year.  Tetanus, diphtheria, and acellular pertussis (Tdap/Td) vaccine.** / 1 dose of Td every 10 years.   Zoster vaccine.** / 1 dose for  adults aged 6 years or older.         Pneumococcal 13-valent conjugate (PCV13) vaccine.    Pneumococcal polysaccharide (PPSV23) vaccine.     Hepatitis A vaccine.** / Consult your health care provider.  Hepatitis B vaccine.** / Consult your health care provider. Screening for abdominal aortic aneurysm (AAA)  by ultrasound is recommended for people who have history of high blood pressure or who are current or former smokers.

## 2014-09-10 LAB — MICROALBUMIN / CREATININE URINE RATIO
Creatinine, Urine: 81 mg/dL
MICROALB UR: 0.6 mg/dL (ref <2.0)
Microalb Creat Ratio: 7.4 mg/g (ref 0.0–30.0)

## 2014-09-10 LAB — PSA: PSA: 0.52 ng/mL (ref <=4.00)

## 2014-09-10 LAB — URINALYSIS, MICROSCOPIC ONLY
Bacteria, UA: NONE SEEN
CASTS: NONE SEEN
CRYSTALS: NONE SEEN
Squamous Epithelial / LPF: NONE SEEN

## 2014-09-10 LAB — VITAMIN D 25 HYDROXY (VIT D DEFICIENCY, FRACTURES): Vit D, 25-Hydroxy: 73 ng/mL (ref 30–100)

## 2014-11-26 ENCOUNTER — Other Ambulatory Visit: Payer: Self-pay | Admitting: Internal Medicine

## 2014-12-17 ENCOUNTER — Encounter: Payer: Self-pay | Admitting: Internal Medicine

## 2014-12-17 ENCOUNTER — Ambulatory Visit (INDEPENDENT_AMBULATORY_CARE_PROVIDER_SITE_OTHER): Payer: Medicare Other | Admitting: Internal Medicine

## 2014-12-17 VITALS — BP 120/84 | HR 56 | Temp 98.1°F | Resp 16 | Ht 72.0 in | Wt 233.2 lb

## 2014-12-17 DIAGNOSIS — R7303 Prediabetes: Secondary | ICD-10-CM

## 2014-12-17 DIAGNOSIS — E039 Hypothyroidism, unspecified: Secondary | ICD-10-CM

## 2014-12-17 DIAGNOSIS — R7309 Other abnormal glucose: Secondary | ICD-10-CM | POA: Diagnosis not present

## 2014-12-17 DIAGNOSIS — E669 Obesity, unspecified: Secondary | ICD-10-CM

## 2014-12-17 DIAGNOSIS — Z79899 Other long term (current) drug therapy: Secondary | ICD-10-CM

## 2014-12-17 DIAGNOSIS — E782 Mixed hyperlipidemia: Secondary | ICD-10-CM | POA: Diagnosis not present

## 2014-12-17 DIAGNOSIS — I10 Essential (primary) hypertension: Secondary | ICD-10-CM

## 2014-12-17 DIAGNOSIS — D485 Neoplasm of uncertain behavior of skin: Secondary | ICD-10-CM | POA: Diagnosis not present

## 2014-12-17 DIAGNOSIS — C44329 Squamous cell carcinoma of skin of other parts of face: Secondary | ICD-10-CM | POA: Diagnosis not present

## 2014-12-17 DIAGNOSIS — E559 Vitamin D deficiency, unspecified: Secondary | ICD-10-CM

## 2014-12-17 DIAGNOSIS — D489 Neoplasm of uncertain behavior, unspecified: Secondary | ICD-10-CM

## 2014-12-17 DIAGNOSIS — Z6831 Body mass index (BMI) 31.0-31.9, adult: Secondary | ICD-10-CM

## 2014-12-17 LAB — BASIC METABOLIC PANEL WITH GFR
BUN: 14 mg/dL (ref 6–23)
CALCIUM: 8.5 mg/dL (ref 8.4–10.5)
CO2: 29 meq/L (ref 19–32)
Chloride: 101 mEq/L (ref 96–112)
Creat: 0.85 mg/dL (ref 0.50–1.35)
GFR, Est African American: >89 mL/min
GFR, Est Non African American: 83 mL/min
Glucose, Bld: 127 mg/dL — ABNORMAL HIGH (ref 70–99)
Potassium: 4.2 mEq/L (ref 3.5–5.3)
Sodium: 142 mEq/L (ref 135–145)

## 2014-12-17 LAB — HEPATIC FUNCTION PANEL
ALBUMIN: 4.1 g/dL (ref 3.5–5.2)
ALK PHOS: 62 U/L (ref 39–117)
ALT: 21 U/L (ref 0–53)
AST: 19 U/L (ref 0–37)
BILIRUBIN TOTAL: 0.9 mg/dL (ref 0.2–1.2)
Bilirubin, Direct: 0.2 mg/dL (ref 0.0–0.3)
Indirect Bilirubin: 0.7 mg/dL (ref 0.2–1.2)
TOTAL PROTEIN: 6.6 g/dL (ref 6.0–8.3)

## 2014-12-17 LAB — TSH: TSH: 4.164 u[IU]/mL (ref 0.350–4.500)

## 2014-12-17 LAB — CBC WITH DIFFERENTIAL/PLATELET
BASOS ABS: 0 10*3/uL (ref 0.0–0.1)
BASOS PCT: 0 % (ref 0–1)
EOS PCT: 3 % (ref 0–5)
Eosinophils Absolute: 0.3 10*3/uL (ref 0.0–0.7)
HCT: 45.1 % (ref 39.0–52.0)
Hemoglobin: 15.2 g/dL (ref 13.0–17.0)
Lymphocytes Relative: 18 % (ref 12–46)
Lymphs Abs: 1.6 10*3/uL (ref 0.7–4.0)
MCH: 30.2 pg (ref 26.0–34.0)
MCHC: 33.7 g/dL (ref 30.0–36.0)
MCV: 89.7 fL (ref 78.0–100.0)
MONO ABS: 0.9 10*3/uL (ref 0.1–1.0)
MPV: 10 fL (ref 8.6–12.4)
Monocytes Relative: 10 % (ref 3–12)
NEUTROS ABS: 6.1 10*3/uL (ref 1.7–7.7)
Neutrophils Relative %: 69 % (ref 43–77)
PLATELETS: 280 10*3/uL (ref 150–400)
RBC: 5.03 MIL/uL (ref 4.22–5.81)
RDW: 15.4 % (ref 11.5–15.5)
WBC: 8.8 10*3/uL (ref 4.0–10.5)

## 2014-12-17 LAB — LIPID PANEL
Cholesterol: 132 mg/dL (ref 0–200)
HDL: 38 mg/dL — AB (ref >=40)
LDL CALC: 76 mg/dL (ref 0–99)
TRIGLYCERIDES: 90 mg/dL (ref <150)
Total CHOL/HDL Ratio: 3.5 Ratio
VLDL: 18 mg/dL (ref 0–40)

## 2014-12-17 LAB — MAGNESIUM: MAGNESIUM: 2.2 mg/dL (ref 1.5–2.5)

## 2014-12-17 LAB — HEMOGLOBIN A1C
HEMOGLOBIN A1C: 5.7 % — AB (ref <5.7)
Mean Plasma Glucose: 117 mg/dL — ABNORMAL HIGH (ref <117)

## 2014-12-17 NOTE — Patient Instructions (Signed)

## 2014-12-17 NOTE — Progress Notes (Signed)
Patient ID: Kevin Ellison, male   DOB: Dec 23, 1933, 81 y.o.   MRN: [id number]   This very nice 81 y.o. MWM presents for 3 month follow up with Hypertension, Hyperlipidemia, Pre-Diabetes and Vitamin D Deficiency.    Patient is treated for HTN & BP has been controlled at home. Today's BP: 120/84 mmHg. Patient has had no complaints of any cardiac type chest pain, palpitations, dyspnea/orthopnea/PND, dizziness, claudication, or dependent edema.   Hyperlipidemia is controlled with diet & meds. Patient denies myalgias or other med SE's. Last Lipids were at goal - Cholesterol 128; HDL 34*; LDL Cholesterol 79; Triglycerides 77 on 09/09/2014.   Also, the patient has history of PreDiabetes with A1c 5.7% in 2012 and he has had no symptoms of reactive hypoglycemia, diabetic polys, paresthesias or visual blurring.  Last A1c was 5.9% on  09/09/2014.   Further, the patient also has history of Vitamin D Deficiency  Of 44 in 2008 and supplements vitamin D without any suspected side-effects. Last vitamin D was 73 on 09/09/2014.  Medication Sig  . DIPROLENE-AF 0.05 % ointment   . DIPROLENE 0.05 % ointment Apply topically 2 (two) times daily.  . bisoprolol-hctz (ZIAC) 10-6.25 MG per tablet Take one tablet by mouth every morning  for blood pressure  . finasteride  5 MG tablet TAKE 1 TABLET BY MOUTH DAILY FOR PROSTATE  . furosemide  40 MG tablet take 1 tablet by mouth daily for blood pressure and fluid  . Levothyroxine 50 MCG tablet TAKE ONE TABLET BY MOUTH ONCE DAILY BEFORE BREAKFAST  . Losartan 100 MG tablet TAKE ONE TABLET BY MOUTH EVERY DAY FOR BLOOD PRESSURE   . meloxicam  15 MG tablet Take 1 tablet by mouth daily after a meal for arthritis  . minoxidil  10 MG tablet Take 1/4 to 1/2 to 1 tablet daily as directed for BP  . pravastatin  40 MG tablet Take one tablet by mouth at bedtime for cholesterol  . tamsulosin  0.4 MG CAPS capsule TAKE ONE CAPSULE BY MOUTH ONE TIME DAILY    Allergies  Allergen Reactions  . Ace  Inhibitors     cough  . Cardura [Doxazosin Mesylate]    PMHx:   Past Medical History  Diagnosis Date  . Hyperlipidemia   . Hypertension   . Thyroid disease   . Prediabetes   . GERD (gastroesophageal reflux disease)    Immunization History  Administered Date(s) Administered  . Influenza Split 03/09/2013  . Influenza, High Dose Seasonal PF 02/17/2014  . Pneumococcal Conjugate-13 02/17/2014  . Td 10/24/2006  . Zoster 05/17/2009   Past Surgical History  Procedure Laterality Date  . Cystoscopy  2002   FHx:    Reviewed / unchanged  SHx:    Reviewed / unchanged  Systems Review:  Constitutional: Denies fever, chills, wt changes, headaches, insomnia, fatigue, night sweats, change in appetite. Eyes: Denies redness, blurred vision, diplopia, discharge, itchy, watery eyes.  ENT: Denies discharge, congestion, post nasal drip, epistaxis, sore throat, earache, hearing loss, dental pain, tinnitus, vertigo, sinus pain, snoring.  CV: Denies chest pain, palpitations, irregular heartbeat, syncope, dyspnea, diaphoresis, orthopnea, PND, claudication or edema. Respiratory: denies cough, dyspnea, DOE, pleurisy, hoarseness, laryngitis, wheezing.  Gastrointestinal: Denies dysphagia, odynophagia, heartburn, reflux, water brash, abdominal pain or cramps, nausea, vomiting, bloating, diarrhea, constipation, hematemesis, melena, hematochezia  or hemorrhoids. Genitourinary: Denies dysuria, frequency, urgency, nocturia, hesitancy, discharge, hematuria or flank pain. Musculoskeletal: Denies arthralgias, myalgias, stiffness, jt. swelling, pain, limping or strain/sprain.  Skin: Denies pruritus,  rash, hives, warts, acne, eczema or change in skin lesion(s). Neuro: No weakness, tremor, incoordination, spasms, paresthesia or pain. Psychiatric: Denies confusion, memory loss or sensory loss. Endo: Denies change in weight, skin or hair change.  Heme/Lymph: No excessive bleeding, bruising or enlarged lymph  nodes.  Physical Exam  BP 120/84  Pulse 56  Temp 98.1 F   Resp 16  Ht 6'   Wt 233 lb 3.2 oz     BMI 31.62  Appears well nourished and in no distress. Eyes: PERRLA, EOMs, conjunctiva no swelling or erythema. Sinuses: No frontal/maxillary tenderness ENT/Mouth: EAC's clear, TM's nl w/o erythema, bulging. Nares clear w/o erythema, swelling, exudates. Oropharynx clear without erythema or exudates. Oral hygiene is good. Tongue normal, non obstructing. Hearing intact.  Neck: Supple. Thyroid nl. Car 2+/2+ without bruits, nodes or JVD. Chest: Respirations nl with BS clear & equal w/o rales, rhonchi, wheezing or stridor.  Cor: Heart sounds normal w/ regular rate and rhythm without sig. murmurs, gallops, clicks, or rubs. Peripheral pulses normal and equal  without edema.  Abdomen: Soft & bowel sounds normal. Non-tender w/o guarding, rebound, hernias, masses, or organomegaly.  Lymphatics: Unremarkable.  Musculoskeletal: Full ROM all peripheral extremities, joint stability, 5/5 strength, and normal gait.  Neuro: Cranial nerves intact, reflexes equal bilaterally. Sensory-motor testing grossly intact. Tendon reflexes grossly intact.  Pysch: Alert & oriented x 3.  Insight and judgement nl & appropriate. No ideations. Skin: Warm, dry without exposed rashes or ecchymosis apparent. There is a large 15 mm diameter  cutaneous horn projecting also 15 mm vertical over the right TMJt area. After informed consent and aseptic prep the area was anesthetized with marcaine 0.5% w/epi. Then with a #10 scalpel the cutaneous horn was sharply excised and wound margins were trimmed and undermined and then skin edges approximated with # 3 interrupted sutures of Nylon 3-0 and compression verified hemostasis. Excised  skin margins sent for path analysis to r/o Atypia.   "New Skin" applied for wound sealing. (CPT -NLZJQBHAL:93790). Patient instructed in wound care & to RTC 5 days for suture removal.  Assessment and Plan:  1.  Essential hypertension  - TSH  2. Hyperlipidemia  - Lipid panel  3. Prediabetes  - Hemoglobin A1c - Insulin, random  4. Vitamin D deficiency  - Vit D  25 hydroxy (rtn osteoporosis monitoring)  5. Hypothyroidism, unspecified hypothyroidism type   6. Obesity (BMI 30-39.9)   7. Medication management  - CBC with Differential/Platelet - BASIC METABOLIC PANEL WITH GFR - Hepatic function panel - Magnesium  8. Neoplasm of uncertain behavior, Right Cheek   - Dermatology pathology  9. BMI 31.0-31.9,adult   Recommended regular exercise, BP monitoring, weight control, and discussed med and SE's. Recommended labs to assess and monitor clinical status. Further disposition pending results of labs. Over 30 minutes of exam, counseling, chart review was performed

## 2014-12-18 LAB — INSULIN, RANDOM: Insulin: 13.2 u[IU]/mL (ref 2.0–19.6)

## 2014-12-18 LAB — VITAMIN D 25 HYDROXY (VIT D DEFICIENCY, FRACTURES): Vit D, 25-Hydroxy: 57 ng/mL (ref 30–100)

## 2014-12-21 ENCOUNTER — Other Ambulatory Visit: Payer: Self-pay | Admitting: Internal Medicine

## 2014-12-21 DIAGNOSIS — C44320 Squamous cell carcinoma of skin of unspecified parts of face: Secondary | ICD-10-CM

## 2014-12-22 ENCOUNTER — Telehealth: Payer: Self-pay | Admitting: *Deleted

## 2014-12-22 NOTE — Telephone Encounter (Signed)
Pt aware of path results and advised he will get a call for an appointment at the [facility] for further excision.

## 2014-12-23 ENCOUNTER — Ambulatory Visit: Payer: Self-pay | Admitting: Internal Medicine

## 2014-12-23 DIAGNOSIS — D485 Neoplasm of uncertain behavior of skin: Secondary | ICD-10-CM | POA: Diagnosis not present

## 2014-12-23 DIAGNOSIS — C44329 Squamous cell carcinoma of skin of other parts of face: Secondary | ICD-10-CM | POA: Diagnosis not present

## 2014-12-23 DIAGNOSIS — C4441 Basal cell carcinoma of skin of scalp and neck: Secondary | ICD-10-CM | POA: Diagnosis not present

## 2015-01-11 ENCOUNTER — Other Ambulatory Visit: Payer: Self-pay | Admitting: *Deleted

## 2015-01-11 ENCOUNTER — Other Ambulatory Visit: Payer: Self-pay | Admitting: Internal Medicine

## 2015-01-11 MED ORDER — BISOPROLOL-HYDROCHLOROTHIAZIDE 10-6.25 MG PO TABS: ORAL_TABLET | ORAL | Status: AC

## 2015-01-12 ENCOUNTER — Other Ambulatory Visit: Payer: Self-pay | Admitting: Internal Medicine

## 2015-01-28 ENCOUNTER — Other Ambulatory Visit: Payer: Self-pay | Admitting: Internal Medicine

## 2015-02-07 DIAGNOSIS — C4441 Basal cell carcinoma of skin of scalp and neck: Secondary | ICD-10-CM | POA: Diagnosis not present

## 2015-03-30 ENCOUNTER — Other Ambulatory Visit: Payer: Self-pay

## 2015-03-30 MED ORDER — LOSARTAN POTASSIUM 100 MG PO TABS: 100.0000 mg | ORAL_TABLET | Freq: Every day | ORAL | Status: AC

## 2015-04-01 ENCOUNTER — Ambulatory Visit (INDEPENDENT_AMBULATORY_CARE_PROVIDER_SITE_OTHER): Payer: Medicare Other | Admitting: Internal Medicine

## 2015-04-01 ENCOUNTER — Encounter: Payer: Self-pay | Admitting: Internal Medicine

## 2015-04-01 VITALS — BP 124/76 | HR 64 | Temp 97.0°F | Resp 16 | Ht 72.0 in | Wt 229.4 lb

## 2015-04-01 DIAGNOSIS — R7303 Prediabetes: Secondary | ICD-10-CM

## 2015-04-01 DIAGNOSIS — Z79899 Other long term (current) drug therapy: Secondary | ICD-10-CM

## 2015-04-01 DIAGNOSIS — Z6831 Body mass index (BMI) 31.0-31.9, adult: Secondary | ICD-10-CM | POA: Insufficient documentation

## 2015-04-01 DIAGNOSIS — R7309 Other abnormal glucose: Secondary | ICD-10-CM | POA: Diagnosis not present

## 2015-04-01 DIAGNOSIS — H109 Unspecified conjunctivitis: Secondary | ICD-10-CM

## 2015-04-01 DIAGNOSIS — E039 Hypothyroidism, unspecified: Secondary | ICD-10-CM

## 2015-04-01 DIAGNOSIS — E782 Mixed hyperlipidemia: Secondary | ICD-10-CM

## 2015-04-01 DIAGNOSIS — I10 Essential (primary) hypertension: Secondary | ICD-10-CM

## 2015-04-01 DIAGNOSIS — E559 Vitamin D deficiency, unspecified: Secondary | ICD-10-CM | POA: Diagnosis not present

## 2015-04-01 LAB — CBC WITH DIFFERENTIAL/PLATELET
Basophils Absolute: 0 10*3/uL (ref 0.0–0.1)
Basophils Relative: 0 % (ref 0–1)
Eosinophils Absolute: 0.5 10*3/uL (ref 0.0–0.7)
Eosinophils Relative: 4 % (ref 0–5)
HCT: 42.4 % (ref 39.0–52.0)
Hemoglobin: 14.6 g/dL (ref 13.0–17.0)
LYMPHS ABS: 1.4 10*3/uL (ref 0.7–4.0)
LYMPHS PCT: 12 % (ref 12–46)
MCH: 30.2 pg (ref 26.0–34.0)
MCHC: 34.4 g/dL (ref 30.0–36.0)
MCV: 87.8 fL (ref 78.0–100.0)
MONO ABS: 1.2 10*3/uL — AB (ref 0.1–1.0)
MONOS PCT: 10 % (ref 3–12)
MPV: 10.6 fL (ref 8.6–12.4)
NEUTROS ABS: 8.7 10*3/uL — AB (ref 1.7–7.7)
NEUTROS PCT: 74 % (ref 43–77)
PLATELETS: 289 10*3/uL (ref 150–400)
RBC: 4.83 MIL/uL (ref 4.22–5.81)
RDW: 14.2 % (ref 11.5–15.5)
WBC: 11.7 10*3/uL — ABNORMAL HIGH (ref 4.0–10.5)

## 2015-04-01 LAB — HEPATIC FUNCTION PANEL
ALBUMIN: 3.6 g/dL (ref 3.6–5.1)
ALT: 9 U/L (ref 9–46)
AST: 13 U/L (ref 10–35)
Alkaline Phosphatase: 55 U/L (ref 40–115)
BILIRUBIN TOTAL: 1 mg/dL (ref 0.2–1.2)
Bilirubin, Direct: 0.2 mg/dL (ref <=0.2)
Indirect Bilirubin: 0.8 mg/dL (ref 0.2–1.2)
TOTAL PROTEIN: 6.4 g/dL (ref 6.1–8.1)

## 2015-04-01 LAB — BASIC METABOLIC PANEL WITH GFR
BUN: 16 mg/dL (ref 7–25)
CHLORIDE: 102 mmol/L (ref 98–110)
CO2: 30 mmol/L (ref 20–31)
CREATININE: 0.92 mg/dL (ref 0.70–1.18)
Calcium: 8.4 mg/dL — ABNORMAL LOW (ref 8.6–10.3)
GFR, Est African American: >89 mL/min (ref >=60)
GFR, Est Non African American: 79 mL/min (ref >=60)
GLUCOSE: 99 mg/dL (ref 65–99)
Potassium: 4.4 mmol/L (ref 3.5–5.3)
Sodium: 140 mmol/L (ref 135–146)

## 2015-04-01 LAB — LIPID PANEL
Cholesterol: 120 mg/dL — ABNORMAL LOW (ref 125–200)
HDL: 32 mg/dL — AB (ref >=40)
LDL Cholesterol: 74 mg/dL (ref <130)
Total CHOL/HDL Ratio: 3.8 Ratio (ref <=5.0)
Triglycerides: 70 mg/dL (ref <150)
VLDL: 14 mg/dL (ref <30)

## 2015-04-01 LAB — HEMOGLOBIN A1C
HEMOGLOBIN A1C: 5.7 % — AB (ref <5.7)
Mean Plasma Glucose: 117 mg/dL — ABNORMAL HIGH (ref <117)

## 2015-04-01 LAB — TSH: TSH: 3.846 u[IU]/mL (ref 0.350–4.500)

## 2015-04-01 LAB — MAGNESIUM: MAGNESIUM: 2.3 mg/dL (ref 1.5–2.5)

## 2015-04-01 MED ORDER — NEOMYCIN-POLYMYXIN-DEXAMETH 3.5-10000-0.1 OP SUSP: OPHTHALMIC | Status: AC

## 2015-04-01 NOTE — Progress Notes (Signed)
Patient ID: Kevin Ellison, male   DOB: 1933-10-11, 81 y.o.   MRN: [id number]     This very nice 81 y.o. MWM presents for 3 month follow up with Hypertension, Hyperlipidemia, Pre-Diabetes and Vitamin D Deficiency. Patient also has DJD of hands/fingers & hips and is stable on Meloxicam.      Patient is treated for HTN since 1994  & BP has been controlled at home. Today's BP: 124/76 mmHg. Patient has had no complaints of any cardiac type chest pain, palpitations, dyspnea/orthopnea/PND, dizziness, claudication, or dependent edema.     Hyperlipidemia is controlled with diet & meds. Patient denies myalgias or other med SE's. Last Lipids were at goal with Cholesterol 132; HDL 38*; LDL 76; Triglycerides 90 on 12/17/2014.     Also, the patient has history of PreDiabetes since July 2012 with A1c 5.7% and has had no symptoms of reactive hypoglycemia, diabetic polys, paresthesias or visual blurring.  Last A1c was still 5.7% on 12/17/2014.     Further, the patient also has history of Vitamin D Deficiency of 44 in 2008 and supplements vitamin D without any suspected side-effects. Last vitamin D was 57 on 12/17/2014.  Medication Sig  . DIPROLENE-AF 0.05 % oint   . DIPROLENE 0.05 % oint Apply topically 2 (two) times daily.  . bisoprolol-hctz (ZIAC) 10-6.25  TAKE ONE TABLET BY MOUTH EVERY MORNING FOR BLOOD PRESSURE  . finasteride  5 MG tablet TAKE 1 TABLET BY MOUTH DAILY FOR PROSTATE  . furosemide  40 MG tablet take 1 tablet by mouth daily for blood pressure and fluid  . levothyroxine  50 MCG tablet TAKE ONE TABLET BY MOUTH ONCE DAILY BEFORE BREAKFAST  . Losartan 100 MG tablet Take 1 tablet (100 mg total) by mouth daily. for blood pressure  . meloxicam  15 MG tablet Take 1 tablet by mouth daily after a meal for arthritis  . minoxidil  10 MG tablet Take 1/4 to 1/2 to 1 tablet daily as directed for BP  . pravastatin  40 MG tablet TAKE 1 TABLET BY MOUTH AT BEDTIME FOR CHOLESTEROL  . tamsulosin  0.4 MG CAPS capsule TAKE  ONE CAPSULE BY MOUTH ONE TIME DAILY   Allergies  Allergen Reactions  . Ace Inhibitors     cough  . Cardura [Doxazosin Mesylate]    PMHx:   Past Medical History  Diagnosis Date  . Hyperlipidemia   . Hypertension   . Thyroid disease   . Prediabetes   . GERD (gastroesophageal reflux disease)    Immunization History  Administered Date(s) Administered  . Influenza Split 03/09/2013, 02/25/2015  . Influenza, High Dose Seasonal PF 02/17/2014  . Pneumococcal Conjugate-13 02/17/2014  . Td 10/24/2006  . Zoster 05/17/2009   Past Surgical History  Procedure Laterality Date  . Cystoscopy  2002   FHx:    Reviewed / unchanged  SHx:    Reviewed / unchanged  Systems Review:  Constitutional: Denies fever, chills, wt changes, headaches, insomnia, fatigue, night sweats, change in appetite. Eyes: Denies redness, blurred vision, diplopia, discharge, itchy, watery eyes.  ENT: Denies discharge, congestion, post nasal drip, epistaxis, sore throat, earache, hearing loss, dental pain, tinnitus, vertigo, sinus pain, snoring.  CV: Denies chest pain, palpitations, irregular heartbeat, syncope, dyspnea, diaphoresis, orthopnea, PND, claudication or edema. Respiratory: denies cough, dyspnea, DOE, pleurisy, hoarseness, laryngitis, wheezing.  Gastrointestinal: Denies dysphagia, odynophagia, heartburn, reflux, water brash, abdominal pain or cramps, nausea, vomiting, bloating, diarrhea, constipation, hematemesis, melena, hematochezia  or hemorrhoids. Genitourinary: Denies dysuria, frequency,  urgency, nocturia, hesitancy, discharge, hematuria or flank pain. Musculoskeletal: Denies arthralgias, myalgias, stiffness, jt. swelling, pain, limping or strain/sprain.  Skin: Denies pruritus, rash, hives, warts, acne, eczema or change in skin lesion(s). Neuro: No weakness, tremor, incoordination, spasms, paresthesia or pain. Psychiatric: Denies confusion, memory loss or sensory loss. Endo: Denies change in weight, skin  or hair change.  Heme/Lymph: No excessive bleeding, bruising or enlarged lymph nodes.  Physical Exam  BP 124/76 mmHg  Pulse 64  Temp(Src) 97 F (36.1 C)  Resp 16  Ht 6' (1.829 m)  Wt 229 lb 6.4 oz (104.055 kg)  BMI 31.11 kg/m2  Appears well nourished and in no distress. Eyes: PERRLA, EOMs, Rt. conjunctiva 1+ erythema. Sinuses: No frontal/maxillary tenderness ENT/Mouth: EAC's clear, TM's nl w/o erythema, bulging. Nares clear w/o erythema, swelling, exudates. Oropharynx clear without erythema or exudates. Oral hygiene is good. Tongue normal, non obstructing. Hearing intact.  Neck: Supple. Thyroid nl. Car 2+/2+ without bruits, nodes or JVD. Chest: Respirations nl with BS clear & equal w/o rales, rhonchi, wheezing or stridor.  Cor: Heart sounds normal w/ regular rate and rhythm without sig. murmurs, gallops, clicks, or rubs. Peripheral pulses normal and equal  without edema.  Abdomen: Soft & bowel sounds normal. Non-tender w/o guarding, rebound, hernias, masses, or organomegaly.  Lymphatics: Unremarkable.  Musculoskeletal: Full ROM all peripheral extremities, joint stability, 5/5 strength, and normal gait.  Skin: Warm, dry without exposed rashes, lesions or ecchymosis apparent.  Neuro: Cranial nerves intact, reflexes equal bilaterally. Sensory-motor testing grossly intact. Tendon reflexes grossly intact.  Pysch: Alert & oriented x 3.  Insight and judgement nl & appropriate. No ideations.  Assessment and Plan:  1. Essential hypertension  - TSH  2. Hyperlipidemia  - Lipid panel  3. Prediabetes  - Hemoglobin A1c - Insulin, random  4. Vitamin D deficiency  - Vit D  25 hydroxy   5. Hypothyroidism   6. BMI 31.62,  adult   7. Medication management  - CBC with Differential/Platelet - BASIC METABOLIC PANEL WITH GFR - Hepatic function panel - Magnesium  8. Conjunctivitis, unspecified laterality  - neomycin-polymyxin b-dexamethasone (MAXITROL) 3.5-10000-0.1 SUSP; Place  1 to 2 drops into the affected eye 4 x day  Dispense: 5 mL; Refill: 1   Recommended regular exercise, BP monitoring, weight control, and discussed med and SE's. Recommended labs to assess and monitor clinical status. Further disposition pending results of labs. Over 30 minutes of exam, counseling, chart review was performed

## 2015-04-01 NOTE — Patient Instructions (Signed)
Recommend Adult Low Dose Aspirin or   coated  Aspirin 81 mg daily   To reduce risk of Colon Cancer 20 %,   Skin Cancer 26 % ,   Melanoma 46%   and   Pancreatic cancer 60%   ++++++++++++++++++++++++++++++++++++++++++++++++++++++  Vitamin D goal   is between 70-100.   Please make sure that you are taking your Vitamin D as directed.   It is very important as a natural anti-inflammatory   helping hair, skin, and nails, as well as reducing stroke and heart attack risk.   It helps your bones and helps with mood.  It also decreases numerous cancer risks so please take it as directed.   Low Vit D is associated with a 200-300% higher risk for CANCER   and 200-300% higher risk for HEART   ATTACK  &  STROKE.   ......................................  It is also associated with higher death rate at younger ages,   autoimmune diseases like Rheumatoid arthritis, Lupus, Multiple Sclerosis.     Also many other serious conditions, like depression, Alzheimer's  Dementia, infertility, muscle aches, fatigue, fibromyalgia - just to name a few.  ++++++++++++++++++++++++++++++++++++++++++++++++  Recommend the book "The END of DIETING" by Dr Joel Fuhrman   & the book "The END of DIABETES " by Dr Joel Fuhrman  At [employer] - get book & Audio CD's     Being diabetic has a  300% increased risk for heart attack, stroke, cancer, and alzheimer- type vascular dementia. It is very important that you work harder with diet by avoiding all foods that are white. Avoid white rice (brown & wild rice is OK), white potatoes (sweetpotatoes in moderation is OK), White bread or wheat bread or anything made out of white flour like bagels, donuts, rolls, buns, biscuits, cakes, pastries, cookies, pizza crust, and pasta (made from white flour & egg whites) - vegetarian pasta or spinach or wheat pasta is OK. Multigrain breads like Arnold's or Pepperidge [employer], or multigrain sandwich thins or flatbreads.  Diet,  exercise and weight loss can reverse and cure diabetes in the early stages.  Diet, exercise and weight loss is very important in the control and prevention of complications of diabetes which affects every system in your body, ie. Brain - dementia/stroke, eyes - glaucoma/blindness, heart - heart attack/heart failure, kidneys - dialysis, stomach - gastric paralysis, intestines - malabsorption, nerves - severe painful neuritis, circulation - gangrene & loss of a leg(s), and finally cancer and Alzheimers.    I recommend avoid fried & greasy foods,  sweets/candy, white rice (brown or wild rice or Quinoa is OK), white potatoes (sweet potatoes are OK) - anything made from white flour - bagels, doughnuts, rolls, buns, biscuits,white and wheat breads, pizza crust and traditional pasta made of white flour & egg white(vegetarian pasta or spinach or wheat pasta is OK).  Multi-grain bread is OK - like multi-grain flat bread or sandwich thins. Avoid alcohol in excess. Exercise is also important.    Eat all the vegetables you want - avoid meat, especially red meat and dairy - especially cheese.  Cheese is the most concentrated form of trans-fats which is the worst thing to clog up our arteries. Veggie cheese is OK which can be found in the fresh produce section at [employer] or [employer] or [employer]  ++++++++++++++++++++++++++++++++++++++++++++++++++ DASH Eating Plan  DASH stands for "Dietary Approaches to Stop Hypertension."   The DASH eating plan is a healthy eating plan that has been shown to reduce high   blood pressure (hypertension). Additional health benefits Kunz include reducing the risk of type 2 diabetes mellitus, heart disease, and stroke. The DASH eating plan Poet also help with weight loss.  WHAT DO I NEED TO KNOW ABOUT THE DASH EATING PLAN? For the DASH eating plan, you will follow these general guidelines:  Choose foods with a percent daily value for sodium of less than 5% (as listed on the food  label).  Use salt-free seasonings or herbs instead of table salt or sea salt.  Check with your health care provider or pharmacist before using salt substitutes.  Eat lower-sodium products, often labeled as "lower sodium" or "no salt added."  Eat fresh foods.  Eat more vegetables, fruits, and low-fat dairy products.    Choose whole grains. Look for the word "whole" as the first word in the ingredient list.  Choose fish   Limit sweets, desserts, sugars, and sugary drinks.  Choose heart-healthy fats.  Eat veggie cheese   Eat more home-cooked food and less restaurant, buffet, and fast food.  Limit fried foods.  Cook foods using methods other than frying.  Limit canned vegetables. If you do use them, rinse them well to decrease the sodium.  When eating at a restaurant, ask that your food be prepared with less salt, or no salt if possible.                      WHAT FOODS CAN I EAT?  Seek help from a dietitian for individual calorie needs. Grains Whole grain or whole wheat bread. Brown rice. Whole grain or whole wheat pasta. Quinoa, bulgur, and whole grain cereals. Low-sodium cereals. Corn or whole wheat flour tortillas. Whole grain cornbread. Whole grain crackers. Low-sodium crackers.  Vegetables Fresh or frozen vegetables (raw, steamed, roasted, or grilled). Low-sodium or reduced-sodium tomato and vegetable juices. Low-sodium or reduced-sodium tomato sauce and paste. Low-sodium or reduced-sodium canned vegetables.   Fruits All fresh, canned (in natural juice), or frozen fruits.  Meat and Other Protein Products  All fish and seafood.  Dried beans, peas, or lentils. Unsalted nuts and seeds. Unsalted canned beans. Dairy Low-fat dairy products, such as skim or 1% milk, 2% or reduced-fat cheeses, low-fat ricotta or cottage cheese, or plain low-fat yogurt. Low-sodium or reduced-sodium cheeses.  Fats and Oils Tub margarines without trans fats. Light or reduced-fat mayonnaise  and salad dressings (reduced sodium). Avocado. Safflower, olive, or canola oils. Natural peanut or almond butter.  Other Unsalted popcorn and pretzels. The items listed above Thueson not be a complete list of recommended foods or beverages. Contact your dietitian for more options.  +++++++++++++++++++++++++++++++++++++++++++  WHAT FOODS ARE NOT RECOMMENDED?  Grains/ White flour or wheat flour  White bread. White pasta. White rice. Refined cornbread. Bagels and croissants. Crackers that contain trans fat.  Vegetables  Creamed or fried vegetables. Vegetables in a . Regular canned vegetables. Regular canned tomato sauce and paste. Regular tomato and vegetable juices.  Fruits Dried fruits. Canned fruit in light or heavy syrup. Fruit juice.  Meat and Other Protein Products Meat in general. Fatty cuts of meat. Ribs, chicken wings, bacon, sausage, bologna, salami, chitterlings, fatback, hot dogs, bratwurst, and packaged luncheon meats. Salted nuts and seeds. Canned beans with salt.  Dairy Whole or 2% milk, cream, half-and-half, and cream cheese. Whole-fat or sweetened yogurt. Full-fat cheeses or blue cheese. Nondairy creamers and whipped toppings. Processed cheese, cheese spreads, or cheese curds.  Condiments Onion and garlic salt, seasoned salt, table salt, and sea  salt. Canned and packaged gravies. Worcestershire sauce. Tartar sauce. Barbecue sauce. Teriyaki sauce. Soy sauce, including reduced sodium. Steak sauce. Fish sauce. Oyster sauce. Cocktail sauce. Horseradish. Ketchup and mustard. Meat flavorings and tenderizers. Bouillon cubes. Hot sauce. Tabasco sauce. Marinades. Taco seasonings. Relishes.  Fats and Oils Butter, stick margarine, lard, shortening, ghee, and bacon fat. Coconut, palm kernel, or palm oils. Regular salad dressings.  Pickles and olives. Salted popcorn and pretzels. The items listed above may not be a complete list of foods and beverages to avoid.

## 2015-04-02 LAB — INSULIN, RANDOM: INSULIN: 6.3 u[IU]/mL (ref 2.0–19.6)

## 2015-04-25 ENCOUNTER — Other Ambulatory Visit: Payer: Self-pay | Admitting: Internal Medicine

## 2015-05-11 ENCOUNTER — Ambulatory Visit (INDEPENDENT_AMBULATORY_CARE_PROVIDER_SITE_OTHER): Payer: Medicare Other | Admitting: Physician Assistant

## 2015-05-11 ENCOUNTER — Encounter: Payer: Self-pay | Admitting: Physician Assistant

## 2015-05-11 ENCOUNTER — Ambulatory Visit (HOSPITAL_COMMUNITY)
Admission: RE | Admit: 2015-05-11 | Discharge: 2015-05-11 | Disposition: A | Discharge status: Home / Self Care | Payer: Medicare Other | Source: Ambulatory Visit | Attending: Physician Assistant | Admitting: Physician Assistant

## 2015-05-11 VITALS — BP 132/70 | HR 65 | Temp 97.9°F | Resp 14 | Ht 72.0 in | Wt 212.0 lb

## 2015-05-11 DIAGNOSIS — R059 Cough, unspecified: Secondary | ICD-10-CM

## 2015-05-11 DIAGNOSIS — R05 Cough: Secondary | ICD-10-CM | POA: Insufficient documentation

## 2015-05-11 DIAGNOSIS — R634 Abnormal weight loss: Secondary | ICD-10-CM

## 2015-05-11 DIAGNOSIS — R197 Diarrhea, unspecified: Secondary | ICD-10-CM

## 2015-05-11 DIAGNOSIS — R062 Wheezing: Secondary | ICD-10-CM | POA: Insufficient documentation

## 2015-05-11 DIAGNOSIS — R918 Other nonspecific abnormal finding of lung field: Secondary | ICD-10-CM | POA: Diagnosis not present

## 2015-05-11 DIAGNOSIS — C7800 Secondary malignant neoplasm of unspecified lung: Secondary | ICD-10-CM

## 2015-05-11 LAB — CBC WITH DIFFERENTIAL/PLATELET
BASOS PCT: 0 % (ref 0–1)
Basophils Absolute: 0 10*3/uL (ref 0.0–0.1)
EOS ABS: 0.8 10*3/uL — AB (ref 0.0–0.7)
EOS PCT: 5 % (ref 0–5)
HCT: 40.3 % (ref 39.0–52.0)
Hemoglobin: 13.5 g/dL (ref 13.0–17.0)
LYMPHS PCT: 11 % — AB (ref 12–46)
Lymphs Abs: 1.7 10*3/uL (ref 0.7–4.0)
MCH: 28.6 pg (ref 26.0–34.0)
MCHC: 33.5 g/dL (ref 30.0–36.0)
MCV: 85.4 fL (ref 78.0–100.0)
MPV: 10 fL (ref 8.6–12.4)
Monocytes Absolute: 1.5 10*3/uL — ABNORMAL HIGH (ref 0.1–1.0)
Monocytes Relative: 10 % (ref 3–12)
NEUTROS ABS: 11.2 10*3/uL — AB (ref 1.7–7.7)
Neutrophils Relative %: 74 % (ref 43–77)
PLATELETS: 368 10*3/uL (ref 150–400)
RBC: 4.72 MIL/uL (ref 4.22–5.81)
RDW: 14.5 % (ref 11.5–15.5)
WBC: 15.2 10*3/uL — AB (ref 4.0–10.5)

## 2015-05-11 LAB — HEPATIC FUNCTION PANEL
ALBUMIN: 3.2 g/dL — AB (ref 3.6–5.1)
ALT: 8 U/L — ABNORMAL LOW (ref 9–46)
AST: 12 U/L (ref 10–35)
Alkaline Phosphatase: 54 U/L (ref 40–115)
BILIRUBIN TOTAL: 0.9 mg/dL (ref 0.2–1.2)
Bilirubin, Direct: 0.2 mg/dL (ref <=0.2)
Indirect Bilirubin: 0.7 mg/dL (ref 0.2–1.2)
Total Protein: 6 g/dL — ABNORMAL LOW (ref 6.1–8.1)

## 2015-05-11 LAB — BASIC METABOLIC PANEL WITH GFR
BUN: 15 mg/dL (ref 7–25)
CO2: 26 mmol/L (ref 20–31)
CREATININE: 1 mg/dL (ref 0.70–1.18)
Calcium: 8.2 mg/dL — ABNORMAL LOW (ref 8.6–10.3)
Chloride: 100 mmol/L (ref 98–110)
GFR, EST AFRICAN AMERICAN: 82 mL/min (ref >=60)
GFR, Est Non African American: 71 mL/min (ref >=60)
Glucose, Bld: 86 mg/dL (ref 65–99)
Potassium: 4.1 mmol/L (ref 3.5–5.3)
SODIUM: 136 mmol/L (ref 135–146)

## 2015-05-11 MED ORDER — AZITHROMYCIN 250 MG PO TABS: ORAL_TABLET | ORAL | Status: AC

## 2015-05-11 MED ORDER — PREDNISONE 20 MG PO TABS: ORAL_TABLET | ORAL | Status: DC

## 2015-05-11 NOTE — Patient Instructions (Signed)
Can do samples steroid inhaler if do not tolerate oral steroids, do 1 puff twice a day and wash mouth out afterwards to avoid yeast.   I will give you a prescription for an antibiotic, but please only take it if you are not feeling better in 7-10 days.  Bronchitis is mostly caused by viruses and the antibiotic will do nothing.  PLEASE TRY TO DO OVER THE COUNTER TREATMENT AND/OR PREDNISONE FOR 5-7 DAYS AND IF YOU ARE NOT GETTING BETTER OR GETTING WORSE THEN YOU CAN START ON AN ANTIBIOTIC GIVEN.  Can take the prednisone AT NIGHT WITH DINNER, it take 8-12 hours to start working so it will NOT affect your sleeping if you take it at night with your food!! Take two pills the first night and 1 or two pill the second night and then 1 pill the other nights.    Rest and stay hydrated.  Make sure you drink plenty of fluids to make sure urine is clear when you urinate.  Water will help thin out mucous. - Take Mucinex DM- Maximum Strength over the counter to thin out and cough up the thick mucous.  Please follow directions on box. -Take Albuterol if prescribed.  Risk of antibiotic use: About 1 in 4 people who take antibiotics have side effects including stomach problems, dizziness, or rashes. Those problems clear up soon after stopping the drugs, but in rare cases antibiotics can cause severe allergic reaction. Over use of antibiotics also encourages the growth of bacteria that can't be controlled easily with drugs. That makes you more vunerable to antibiotic-resistant infections and undermines the benefits of antibiotics for others.   Waste of Money: Antibiotics often aren't very expensive, but any money spent on unnecessary drugs is money down the drain.   When are antibiotics needed? Only when symptoms last longer than a week.  Start to improve but then worsen again  Please call the office or message through My Chart if you have any questions.   Acute Bronchitis Bronchitis is when the airways that  extend from the windpipe into the lungs get red, puffy, and painful (inflamed). Bronchitis often causes thick spit (mucus) to develop. This leads to a cough. A cough is the most common symptom of bronchitis. In acute bronchitis, the condition usually begins suddenly and goes away over time (usually in 2 weeks). Smoking, allergies, and asthma can make bronchitis worse. Repeated episodes of bronchitis may cause more lung problems.  Most common cause of Bronchitis is viruses (rhinovirus, coronavirus, RSV).  Therefore, not requiring an antibiotic; as antibiotics only treat bacterial infections.  HOME CARE  Rest.  Drink enough fluids to keep your pee (urine) clear or pale yellow (unless you need to limit fluids as told by your doctor).  Only take over-the-counter or prescription medicines as told by your doctor.  Avoid smoking and secondhand smoke. These can make bronchitis worse. If you are a smoker, think about using nicotine gum or skin patches. Quitting smoking will help your lungs heal faster.  Reduce the chance of getting bronchitis again by:  Washing your hands often.  Avoiding people with cold symptoms.  Trying not to touch your hands to your mouth, nose, or eyes.  Follow up with your doctor as told. GET HELP IF: Your symptoms do not improve after 1 week of treatment. Symptoms include:  Cough.  Fever.  Coughing up thick spit.  Body aches.  Chest congestion.  Chills.  Shortness of breath.  Sore throat. GET HELP RIGHT AWAY  IF:   You have an increased fever.  You have chills.  You have severe shortness of breath.  You have bloody thick spit (sputum).  You throw up (vomit) often.  You lose too much body fluid (dehydration).  You have a severe headache.  You faint. MAKE SURE YOU:   Understand these instructions.  Will watch your condition.  Will get help right away if you are not doing well or get worse. Document Released: 11/14/2007 Document Revised:  01/28/2013 Document Reviewed: 11/18/2012 [facility] Patient Information 2015 [place], [place]. This information is not intended to replace advice given to you by your health care provider. Make sure you discuss any questions you have with your health care provider.

## 2015-05-11 NOTE — Progress Notes (Addendum)
Subjective:    Patient ID: Kevin Ellison, male    DOB: Oct 04, 1933, 81 y.o.   MRN: [id number]  HPI 81 y.o. WM presents with cough and diarrhea. Has had chest congestion x 3 weeks, with no fever but + chills, green mucus, tried mucinex with minimal relief. He started to have diarrhea x 4 days ago, has decreased appetite, has had weight loss. No AB pain, fever, chills, night sweats.   Wt Readings from Last 3 Encounters:  05/11/15 212 lb (96.163 kg)  04/01/15 229 lb 6.4 oz (104.055 kg)  12/17/14 233 lb 3.2 oz (105.779 kg)    Blood pressure 132/70, pulse 65, temperature 97.9 F (36.6 C), temperature source Temporal, resp. rate 14, height 6' (1.829 m), weight 212 lb (96.163 kg), SpO2 93 %.  Past Medical History  Diagnosis Date  . Hyperlipidemia   . Hypertension   . Thyroid disease   . Prediabetes   . GERD (gastroesophageal reflux disease)     Current Outpatient Prescriptions on File Prior to Visit  Medication Sig Dispense Refill  . augmented betamethasone dipropionate (DIPROLENE-AF) 0.05 % ointment     . betamethasone dipropionate (DIPROLENE) 0.05 % ointment Apply topically 2 (two) times daily. 50 g 99  . bisoprolol-hydrochlorothiazide (ZIAC) 10-6.25 MG per tablet TAKE ONE TABLET BY MOUTH EVERY MORNING FOR BLOOD PRESSURE 90 tablet 2  . finasteride (PROSCAR) 5 MG tablet TAKE 1 TABLET BY MOUTH DAILY FOR PROSTATE 90 tablet 99  . furosemide (LASIX) 40 MG tablet take 1 tablet by mouth daily for blood pressure and fluid 90 tablet PRN  . levothyroxine (SYNTHROID, LEVOTHROID) 50 MCG tablet TAKE ONE TABLET BY MOUTH ONCE DAILY BEFORE BREAKFAST 90 tablet 3  . losartan (COZAAR) 100 MG tablet Take 1 tablet (100 mg total) by mouth daily. for blood pressure 90 tablet 1  . meloxicam (MOBIC) 15 MG tablet TAKE ONE TABLET BY MOUTH ONE TIME DAILY AFTER A MEAL FOR ARTHRITIS 30 tablet 6  . minoxidil (LONITEN) 10 MG tablet Take 1/4 to 1/2 to 1 tablet daily as directed for BP 90 tablet 99  . pravastatin  (PRAVACHOL) 40 MG tablet TAKE 1 TABLET BY MOUTH AT BEDTIME FOR CHOLESTEROL 90 tablet 3  . tamsulosin (FLOMAX) 0.4 MG CAPS capsule TAKE ONE CAPSULE BY MOUTH ONE TIME DAILY 90 capsule 3   No current facility-administered medications on file prior to visit.    Review of Systems  Constitutional: Positive for chills, appetite change, fatigue and unexpected weight change. Negative for fever, diaphoresis and activity change.  HENT: Positive for congestion. Negative for dental problem, drooling, ear discharge, ear pain, facial swelling, hearing loss, mouth sores, nosebleeds, postnasal drip, rhinorrhea, sinus pressure, sneezing, sore throat, tinnitus and voice change.   Eyes: Negative.   Respiratory: Positive for cough and chest tightness. Negative for apnea, choking, shortness of breath, wheezing and stridor.   Cardiovascular: Negative.  Negative for chest pain, palpitations and leg swelling.  Gastrointestinal: Positive for diarrhea. Negative for nausea, vomiting, abdominal pain, constipation, blood in stool, abdominal distention, anal bleeding and rectal pain.  Genitourinary: Negative.   Musculoskeletal: Negative.   Neurological: Negative.  Negative for dizziness.       Objective:   Physical Exam  Constitutional: He is oriented to person, place, and time. He appears well-developed. No distress.  HENT:  Head: Normocephalic.  Right Ear: External ear normal.  Left Ear: External ear normal.  Nose: Nose normal.  Mouth/Throat: Oropharynx is clear and moist. No oropharyngeal exudate.  Eyes: Conjunctivae  are normal. Pupils are equal, round, and reactive to light.  Neck: Normal range of motion. Neck supple.  Cardiovascular: Normal rate and regular rhythm.   Pulmonary/Chest: Effort normal. No accessory muscle usage. He has no decreased breath sounds. He has wheezes (diffuse). He has rhonchi in the right lower field. He has no rales.  Abdominal: Soft. Bowel sounds are normal. He exhibits no distension  and no mass. There is no tenderness. There is no rebound and no guarding.  Musculoskeletal: Normal range of motion.  Lymphadenopathy:    He has no cervical adenopathy.  Neurological: He is alert and oriented to person, place, and time. No cranial nerve deficit.  Skin: Skin is warm and dry. No rash noted. He is not diaphoretic.      Assessment & Plan:  Cough- diffuse wheezing with RLL rales/decreased breath sounds with weight loss, get CXR to rule out pneumonia versus cancer with recent weight loss Zpak, prednisone, sample inhaler of dulera, allergy pill   Diarrhea-  Benign AB- possible gastroenteritis, due for colonoscopy this year, negative hemoccult card in March, check labs to rule out dehydration.     Addendum: CXR shows  IMPRESSION: 1. Innumerable nodular opacities scattered throughout both lungs, most prominent in the lower lungs, highly suspicious for pulmonary metastases. 2. Mild thickening of the bilateral paratracheal stripe and mildly prominent hilar, which could indicate mediastinal/hilar adenopathy. 3. Possible trace right pleural effusion. 4. Recommend further evaluation with chest CT with IV contrast. Will schedule CT chest/AB/pelvis with contrast Called and spoke with patient, understands.

## 2015-05-12 ENCOUNTER — Other Ambulatory Visit: Payer: Self-pay | Admitting: Physician Assistant

## 2015-05-12 DIAGNOSIS — C7800 Secondary malignant neoplasm of unspecified lung: Secondary | ICD-10-CM

## 2015-05-12 DIAGNOSIS — R918 Other nonspecific abnormal finding of lung field: Secondary | ICD-10-CM

## 2015-05-12 NOTE — Addendum Note (Signed)
Addended by: Vicie Mutters R on: 05/12/2015 09:54 AM   Modules accepted: Orders

## 2015-05-16 ENCOUNTER — Other Ambulatory Visit: Payer: Self-pay | Admitting: Physician Assistant

## 2015-05-16 DIAGNOSIS — R918 Other nonspecific abnormal finding of lung field: Secondary | ICD-10-CM

## 2015-05-16 DIAGNOSIS — C7800 Secondary malignant neoplasm of unspecified lung: Secondary | ICD-10-CM

## 2015-05-16 DIAGNOSIS — R634 Abnormal weight loss: Secondary | ICD-10-CM

## 2015-05-17 ENCOUNTER — Ambulatory Visit (HOSPITAL_BASED_OUTPATIENT_CLINIC_OR_DEPARTMENT_OTHER)
Admission: RE | Admit: 2015-05-17 | Discharge: 2015-05-17 | Disposition: A | Discharge status: Home / Self Care | Payer: Medicare Other | Source: Ambulatory Visit | Attending: Physician Assistant | Admitting: Physician Assistant

## 2015-05-17 ENCOUNTER — Ambulatory Visit (HOSPITAL_COMMUNITY): Payer: Medicare Other

## 2015-05-17 ENCOUNTER — Encounter (HOSPITAL_BASED_OUTPATIENT_CLINIC_OR_DEPARTMENT_OTHER): Payer: Self-pay

## 2015-05-17 ENCOUNTER — Other Ambulatory Visit (HOSPITAL_BASED_OUTPATIENT_CLINIC_OR_DEPARTMENT_OTHER): Payer: Medicare Other

## 2015-05-17 ENCOUNTER — Other Ambulatory Visit: Payer: Self-pay | Admitting: Physician Assistant

## 2015-05-17 DIAGNOSIS — R634 Abnormal weight loss: Secondary | ICD-10-CM

## 2015-05-17 DIAGNOSIS — R590 Localized enlarged lymph nodes: Secondary | ICD-10-CM | POA: Insufficient documentation

## 2015-05-17 DIAGNOSIS — R918 Other nonspecific abnormal finding of lung field: Secondary | ICD-10-CM | POA: Insufficient documentation

## 2015-05-17 DIAGNOSIS — I700 Atherosclerosis of aorta: Secondary | ICD-10-CM | POA: Insufficient documentation

## 2015-05-17 DIAGNOSIS — I2510 Atherosclerotic heart disease of native coronary artery without angina pectoris: Secondary | ICD-10-CM | POA: Diagnosis not present

## 2015-05-17 DIAGNOSIS — C7800 Secondary malignant neoplasm of unspecified lung: Secondary | ICD-10-CM | POA: Diagnosis not present

## 2015-05-17 DIAGNOSIS — M5134 Other intervertebral disc degeneration, thoracic region: Secondary | ICD-10-CM | POA: Diagnosis not present

## 2015-05-17 DIAGNOSIS — C801 Malignant (primary) neoplasm, unspecified: Secondary | ICD-10-CM

## 2015-05-17 DIAGNOSIS — C3490 Malignant neoplasm of unspecified part of unspecified bronchus or lung: Secondary | ICD-10-CM | POA: Diagnosis not present

## 2015-05-17 DIAGNOSIS — M5136 Other intervertebral disc degeneration, lumbar region: Secondary | ICD-10-CM | POA: Insufficient documentation

## 2015-05-17 DIAGNOSIS — J90 Pleural effusion, not elsewhere classified: Secondary | ICD-10-CM | POA: Insufficient documentation

## 2015-05-17 DIAGNOSIS — C7989 Secondary malignant neoplasm of other specified sites: Secondary | ICD-10-CM

## 2015-05-17 DIAGNOSIS — E278 Other specified disorders of adrenal gland: Secondary | ICD-10-CM

## 2015-05-17 MED ORDER — IOHEXOL 300 MG/ML  SOLN
75.0000 mL | Freq: Once | INTRAMUSCULAR | Status: AC | PRN
Start: 2015-05-17 — End: 2015-05-17
  Administered 2015-05-17: 75 mL via INTRAVENOUS

## 2015-05-17 MED ORDER — IOHEXOL 300 MG/ML  SOLN
25.0000 mL | Freq: Once | INTRAMUSCULAR | Status: AC | PRN
  Administered 2015-05-17: 25 mL via INTRAVENOUS

## 2015-05-18 ENCOUNTER — Other Ambulatory Visit: Payer: Self-pay | Admitting: Internal Medicine

## 2015-05-18 ENCOUNTER — Other Ambulatory Visit: Payer: Self-pay | Admitting: Physician Assistant

## 2015-05-18 DIAGNOSIS — C7800 Secondary malignant neoplasm of unspecified lung: Secondary | ICD-10-CM

## 2015-05-24 ENCOUNTER — Ambulatory Visit (INDEPENDENT_AMBULATORY_CARE_PROVIDER_SITE_OTHER): Payer: Medicare Other | Admitting: Internal Medicine

## 2015-05-24 ENCOUNTER — Other Ambulatory Visit: Payer: Medicare Other

## 2015-05-24 ENCOUNTER — Encounter: Payer: Self-pay | Admitting: Internal Medicine

## 2015-05-24 VITALS — BP 130/78 | HR 73 | Ht 72.0 in | Wt 214.8 lb

## 2015-05-24 DIAGNOSIS — R918 Other nonspecific abnormal finding of lung field: Secondary | ICD-10-CM | POA: Diagnosis not present

## 2015-05-24 NOTE — Assessment & Plan Note (Signed)
See CT chest 05/17/15 - PET 05/1515 >>>  Clearly widely metastatic dz of unknown primary at this point though lung less likely based on how long ago he smoked and absence of an obvious primary - agree PET next and then prob do u/s guided bx of any hot nodes  Discussed in detail all the  indications, usual  risks and alternatives  relative to the benefits with patient who agrees to proceed with pet directed bx next   Total time devoted to counseling  = 35/9mreview case/ imaging with pt/ discussion of options/alternatives/ giving and going over instructions (see avs)

## 2015-05-24 NOTE — Progress Notes (Signed)
Subjective:    Patient ID: Kevin Ellison, male    DOB: [birth year],    MRN: [id number]  HPI  89 yowm father of Wyn Quaker, MD quit smoking 1989 referrd to pulmonary clinic 05/24/2015 by Dr Lawanna Kobus for [place]     05/24/2015 1st [facility] office visit/ Shaney Deckman   Chief Complaint  Patient presents with  . Pulmonary Consult    Referred by Dr. Melford Aase for eval of abnormal ct chest. CT was done for eval of unintended weight.   onset cough x 3 weeks abrupt/ yellow mucus never bloody, breathing better p coughing up mucus and able to climb hill at [facility] to [place] [facility] sob   No obvious other patterns in day to day or daytime variabilty or assoc  cp or chest tightness, subjective wheeze overt sinus or hb symptoms. No unusual exp hx or h/o childhood pna/ asthma or knowledge of premature birth.  Sleeping ok without nocturnal  or early am exacerbation  of respiratory  c/o's or need for noct saba. Also denies any obvious fluctuation of symptoms with weather or environmental changes or other aggravating or alleviating factors except as outlined above   Current Medications, Allergies, Complete Past Medical History, Past Surgical History, Family History, and Social History were reviewed in [employer] record.                 Review of Systems  Constitutional: Positive for unexpected weight change. Negative for fever, chills, activity change and appetite change.  HENT: Negative for congestion, dental problem, postnasal drip, rhinorrhea, sneezing, sore throat, trouble swallowing and voice change.   Eyes: Negative for visual disturbance.  Respiratory: Negative for cough, choking and shortness of breath.   Cardiovascular: Negative for chest pain and leg swelling.  Gastrointestinal: Negative for nausea, vomiting and abdominal pain.  Genitourinary: Negative for difficulty urinating.  Musculoskeletal: Negative for arthralgias.  Skin: Negative for rash.  Psychiatric/Behavioral:  Negative for behavioral problems and confusion.       Objective:   Physical Exam  Pleasant amb wm, minimally hoarse  Wt Readings from Last 3 Encounters:  05/24/15 214 lb 12.8 oz (97.433 kg)  05/11/15 212 lb (96.163 kg)  04/01/15 229 lb 6.4 oz (104.055 kg)    Vital signs reviewed   HEENT: nl dentition, turbinates, and oropharynx. Nl external ear canals without cough reflex   NECK :  without JVD/Nodes/TM/ nl carotid upstrokes bilaterally   LUNGS: no acc muscle use,  Min barrel  Chest /  A few insp pops in insp, a few exp rhonchi bilaterally      CV:  RRR  no s3 or murmur or increase in P2, no edema   ABD:  soft and nontender with nl inspiratory excursion in the supine position. No bruits or organomegaly, bowel sounds nl  MS:  Nl gait/ ext warm without deformities, calf tenderness, cyanosis or clubbing No obvious joint restrictions   SKIN: warm and dry without lesions    NEURO:  alert, approp, nl sensorium with  no motor deficits      I personally reviewed images and agree with radiology impression as follows:  CT Chest   05/17/15 1. Widespread pulmonary metastasis with numerous nodules and masses identified both lungs. 2. Extensive thoracic metastasis with bilateral mediastinal adenopathy, left supraclavicular adenopathy and left axillary adenopathy. 3. Right pleural effusion 4. Indeterminate nodule and left adrenal gland measures 1.6 cm. Cannot rule out metastatic disease. 5. Thoracic and lumbar degenerative disc disease. 6. Aortic  atherosclerosis and multi vessel coronary artery calcification      Assessment & Plan:

## 2015-05-24 NOTE — Patient Instructions (Signed)
I will call you as soon as I have any information on your PET Scan to determine the  best approach/option  to do a biopsy

## 2015-05-26 ENCOUNTER — Encounter (HOSPITAL_COMMUNITY): Payer: Self-pay

## 2015-05-26 ENCOUNTER — Encounter (HOSPITAL_COMMUNITY)
Admission: RE | Admit: 2015-05-26 | Discharge: 2015-05-26 | Disposition: A | Discharge status: Home / Self Care | Payer: Medicare Other | Source: Ambulatory Visit | Attending: Physician Assistant | Admitting: Physician Assistant

## 2015-05-26 DIAGNOSIS — C3490 Malignant neoplasm of unspecified part of unspecified bronchus or lung: Secondary | ICD-10-CM | POA: Diagnosis not present

## 2015-05-26 DIAGNOSIS — C7800 Secondary malignant neoplasm of unspecified lung: Secondary | ICD-10-CM

## 2015-05-26 DIAGNOSIS — R918 Other nonspecific abnormal finding of lung field: Secondary | ICD-10-CM

## 2015-05-26 LAB — GLUCOSE, CAPILLARY: Glucose-Capillary: 109 mg/dL — ABNORMAL HIGH (ref 65–99)

## 2015-05-26 MED ORDER — FLUDEOXYGLUCOSE F - 18 (FDG) INJECTION
11.1000 | Freq: Once | INTRAVENOUS | Status: AC | PRN
  Administered 2015-05-26: 11.1 via INTRAVENOUS

## 2015-05-27 ENCOUNTER — Other Ambulatory Visit: Payer: Self-pay | Admitting: Internal Medicine

## 2015-05-27 DIAGNOSIS — R918 Other nonspecific abnormal finding of lung field: Secondary | ICD-10-CM

## 2015-05-27 NOTE — Progress Notes (Signed)
Quick Note:  Order sent to [facility] ______ 

## 2015-05-30 ENCOUNTER — Other Ambulatory Visit: Payer: Self-pay | Admitting: Internal Medicine

## 2015-06-03 ENCOUNTER — Other Ambulatory Visit: Payer: Self-pay | Admitting: Radiology

## 2015-06-07 ENCOUNTER — Encounter (HOSPITAL_COMMUNITY): Payer: Self-pay

## 2015-06-07 ENCOUNTER — Ambulatory Visit (HOSPITAL_COMMUNITY)
Admission: RE | Admit: 2015-06-07 | Discharge: 2015-06-07 | Disposition: A | Discharge status: Home / Self Care | Payer: Medicare Other | Source: Ambulatory Visit | Attending: Internal Medicine | Admitting: Internal Medicine

## 2015-06-07 DIAGNOSIS — G4700 Insomnia, unspecified: Secondary | ICD-10-CM | POA: Diagnosis not present

## 2015-06-07 DIAGNOSIS — E039 Hypothyroidism, unspecified: Secondary | ICD-10-CM | POA: Diagnosis not present

## 2015-06-07 DIAGNOSIS — Z515 Encounter for palliative care: Secondary | ICD-10-CM | POA: Diagnosis not present

## 2015-06-07 DIAGNOSIS — C771 Secondary and unspecified malignant neoplasm of intrathoracic lymph nodes: Secondary | ICD-10-CM | POA: Diagnosis not present

## 2015-06-07 DIAGNOSIS — K219 Gastro-esophageal reflux disease without esophagitis: Secondary | ICD-10-CM | POA: Diagnosis not present

## 2015-06-07 DIAGNOSIS — C7951 Secondary malignant neoplasm of bone: Secondary | ICD-10-CM | POA: Diagnosis not present

## 2015-06-07 DIAGNOSIS — J90 Pleural effusion, not elsewhere classified: Secondary | ICD-10-CM | POA: Diagnosis not present

## 2015-06-07 DIAGNOSIS — C7800 Secondary malignant neoplasm of unspecified lung: Secondary | ICD-10-CM | POA: Diagnosis not present

## 2015-06-07 DIAGNOSIS — J9601 Acute respiratory failure with hypoxia: Secondary | ICD-10-CM | POA: Diagnosis not present

## 2015-06-07 DIAGNOSIS — I10 Essential (primary) hypertension: Secondary | ICD-10-CM | POA: Diagnosis not present

## 2015-06-07 DIAGNOSIS — R591 Generalized enlarged lymph nodes: Secondary | ICD-10-CM | POA: Diagnosis not present

## 2015-06-07 DIAGNOSIS — E785 Hyperlipidemia, unspecified: Secondary | ICD-10-CM | POA: Diagnosis not present

## 2015-06-07 DIAGNOSIS — Z66 Do not resuscitate: Secondary | ICD-10-CM | POA: Diagnosis not present

## 2015-06-07 DIAGNOSIS — Z87891 Personal history of nicotine dependence: Secondary | ICD-10-CM | POA: Diagnosis not present

## 2015-06-07 DIAGNOSIS — R7303 Prediabetes: Secondary | ICD-10-CM | POA: Diagnosis not present

## 2015-06-07 DIAGNOSIS — F419 Anxiety disorder, unspecified: Secondary | ICD-10-CM | POA: Diagnosis not present

## 2015-06-07 DIAGNOSIS — R590 Localized enlarged lymph nodes: Secondary | ICD-10-CM | POA: Diagnosis not present

## 2015-06-07 DIAGNOSIS — C3490 Malignant neoplasm of unspecified part of unspecified bronchus or lung: Secondary | ICD-10-CM | POA: Diagnosis not present

## 2015-06-07 DIAGNOSIS — C7802 Secondary malignant neoplasm of left lung: Secondary | ICD-10-CM | POA: Diagnosis not present

## 2015-06-07 DIAGNOSIS — Z8249 Family history of ischemic heart disease and other diseases of the circulatory system: Secondary | ICD-10-CM | POA: Diagnosis not present

## 2015-06-07 DIAGNOSIS — N400 Enlarged prostate without lower urinary tract symptoms: Secondary | ICD-10-CM | POA: Diagnosis not present

## 2015-06-07 DIAGNOSIS — Z22322 Carrier or suspected carrier of Methicillin resistant Staphylococcus aureus: Secondary | ICD-10-CM | POA: Diagnosis not present

## 2015-06-07 DIAGNOSIS — J910 Malignant pleural effusion: Secondary | ICD-10-CM | POA: Diagnosis not present

## 2015-06-07 DIAGNOSIS — R000 Tachycardia, unspecified: Secondary | ICD-10-CM | POA: Diagnosis not present

## 2015-06-07 DIAGNOSIS — R0602 Shortness of breath: Secondary | ICD-10-CM | POA: Diagnosis not present

## 2015-06-07 DIAGNOSIS — D72829 Elevated white blood cell count, unspecified: Secondary | ICD-10-CM | POA: Diagnosis not present

## 2015-06-07 DIAGNOSIS — C770 Secondary and unspecified malignant neoplasm of lymph nodes of head, face and neck: Secondary | ICD-10-CM | POA: Diagnosis not present

## 2015-06-07 DIAGNOSIS — R918 Other nonspecific abnormal finding of lung field: Secondary | ICD-10-CM

## 2015-06-07 DIAGNOSIS — C7801 Secondary malignant neoplasm of right lung: Secondary | ICD-10-CM | POA: Diagnosis not present

## 2015-06-07 DIAGNOSIS — C773 Secondary and unspecified malignant neoplasm of axilla and upper limb lymph nodes: Secondary | ICD-10-CM | POA: Diagnosis not present

## 2015-06-07 DIAGNOSIS — C801 Malignant (primary) neoplasm, unspecified: Secondary | ICD-10-CM | POA: Diagnosis not present

## 2015-06-07 LAB — BASIC METABOLIC PANEL
ANION GAP: 10 (ref 5–15)
BUN: 16 mg/dL (ref 6–20)
CHLORIDE: 101 mmol/L (ref 101–111)
CO2: 28 mmol/L (ref 22–32)
CREATININE: 0.98 mg/dL (ref 0.61–1.24)
Calcium: 8.5 mg/dL — ABNORMAL LOW (ref 8.9–10.3)
GFR calc non Af Amer: >60 mL/min (ref >60)
Glucose, Bld: 121 mg/dL — ABNORMAL HIGH (ref 65–99)
POTASSIUM: 4 mmol/L (ref 3.5–5.1)
SODIUM: 139 mmol/L (ref 135–145)

## 2015-06-07 LAB — CBC WITH DIFFERENTIAL/PLATELET
Basophils Absolute: 0 10*3/uL (ref 0.0–0.1)
Basophils Relative: 0 %
EOS ABS: 0.2 10*3/uL (ref 0.0–0.7)
Eosinophils Relative: 2 %
HEMATOCRIT: 42 % (ref 39.0–52.0)
HEMOGLOBIN: 13.6 g/dL (ref 13.0–17.0)
LYMPHS ABS: 0.9 10*3/uL (ref 0.7–4.0)
LYMPHS PCT: 6 %
MCH: 27.8 pg (ref 26.0–34.0)
MCHC: 32.4 g/dL (ref 30.0–36.0)
MCV: 85.7 fL (ref 78.0–100.0)
Monocytes Absolute: 1.4 10*3/uL — ABNORMAL HIGH (ref 0.1–1.0)
Monocytes Relative: 9 %
NEUTROS ABS: 13.6 10*3/uL — AB (ref 1.7–7.7)
NEUTROS PCT: 83 %
Platelets: 319 10*3/uL (ref 150–400)
RBC: 4.9 MIL/uL (ref 4.22–5.81)
RDW: 14.9 % (ref 11.5–15.5)
WBC: 16.1 10*3/uL — ABNORMAL HIGH (ref 4.0–10.5)

## 2015-06-07 LAB — PROTIME-INR
INR: 1.21 (ref 0.00–1.49)
PROTHROMBIN TIME: 15.5 s — AB (ref 11.6–15.2)

## 2015-06-07 MED ORDER — FENTANYL CITRATE (PF) 100 MCG/2ML IJ SOLN
INTRAMUSCULAR | Status: AC
  Filled 2015-06-07: qty 4

## 2015-06-07 MED ORDER — FENTANYL CITRATE (PF) 100 MCG/2ML IJ SOLN
INTRAMUSCULAR | Status: AC | PRN
  Administered 2015-06-07 (×2): 25 ug via INTRAVENOUS

## 2015-06-07 MED ORDER — MIDAZOLAM HCL 2 MG/2ML IJ SOLN
INTRAMUSCULAR | Status: AC
  Filled 2015-06-07: qty 6

## 2015-06-07 MED ORDER — SODIUM CHLORIDE 0.9 % IV SOLN
INTRAVENOUS | Status: DC
  Administered 2015-06-07: 11:00:00 via INTRAVENOUS

## 2015-06-07 MED ORDER — MIDAZOLAM HCL 2 MG/2ML IJ SOLN
INTRAMUSCULAR | Status: AC | PRN
  Administered 2015-06-07: 1 mg via INTRAVENOUS
  Administered 2015-06-07 (×3): 0.5 mg via INTRAVENOUS

## 2015-06-07 NOTE — Discharge Instructions (Signed)
Needle Biopsy, Care After These instructions give you information about caring for yourself after your procedure. Your doctor may also give you more specific instructions. Call your doctor if you have any problems or questions after your procedure. HOME CARE  Rest as told by your doctor.  Take medicines only as told by your doctor.  There are many different ways to close and cover the biopsy site, including stitches (sutures), skin glue, and adhesive strips. Follow instructions from your doctor about:  How to take care of your biopsy site.  When and how you should change your bandage (dressing).  When you should remove your dressing.  Removing whatever was used to close your biopsy site.  Check your biopsy site every day for signs of infection. Watch for:  Redness, swelling, or pain.  Fluid, blood, or pus. GET HELP IF:  You have a fever.  You have redness, swelling, or pain at the biopsy site, and it lasts longer than a few days.  You have fluid, blood, or pus coming from the biopsy site.  You feel sick to your stomach (nauseous).  You throw up (vomit). GET HELP RIGHT AWAY IF:  You are short of breath.  You have trouble breathing.  Your chest hurts.  You feel dizzy or you pass out (faint).  You have bleeding that does not stop with pressure or a bandage.  You cough up blood.  Your belly (abdomen) hurts.   This information is not intended to replace advice given to you by your health care provider. Make sure you discuss any questions you have with your health care provider.   Document Released: 05/10/2008 Document Revised: 10/12/2014 Document Reviewed: 05/24/2014 Elsevier Interactive Patient Education 2016 [employer]    Moderate Conscious Sedation, Adult Sedation is the use of medicines to promote relaxation and relieve discomfort and anxiety. Moderate conscious sedation is a type of sedation. Under moderate conscious sedation you are less alert than  normal but are still able to respond to instructions or stimulation. Moderate conscious sedation is used during short medical and dental procedures. It is milder than deep sedation or general anesthesia and allows you to return to your regular activities sooner. LET [facility] CARE PROVIDER KNOW ABOUT:   Any allergies you have.  All medicines you are taking, including vitamins, herbs, eye drops, creams, and over-the-counter medicines.  Use of steroids (by mouth or creams).  Previous problems you or members of your family have had with the use of anesthetics.  Any blood disorders you have.  Previous surgeries you have had.  Medical conditions you have.  Possibility of pregnancy, if this applies.  Use of cigarettes, alcohol, or illegal drugs. RISKS AND COMPLICATIONS Generally, this is a safe procedure. However, as with any procedure, problems can occur. Possible problems include:  Oversedation.  Trouble breathing on your own. You may need to have a breathing tube until you are awake and breathing on your own.  Allergic reaction to any of the medicines used for the procedure. BEFORE THE PROCEDURE  You may have blood tests done. These tests can help show how well your kidneys and liver are working. They can also show how well your blood clots.  A physical exam will be done.  Only take medicines as directed by your health care provider. You may need to stop taking medicines (such as blood thinners, aspirin, or nonsteroidal anti-inflammatory drugs) before the procedure.   Do not eat or drink at least 6 hours before the procedure or  as directed by your health care provider.  Arrange for a responsible adult, family member, or friend to take you home after the procedure. He or she should stay with you for at least 24 hours after the procedure, until the medicine has worn off. PROCEDURE   An intravenous (IV) catheter will be inserted into one of your veins. Medicine will be able to  flow directly into your body through this catheter. You may be given medicine through this tube to help prevent pain and help you relax.  The medical or dental procedure will be done. AFTER THE PROCEDURE  You will stay in a recovery area until the medicine has worn off. Your blood pressure and pulse will be checked.   Depending on the procedure you had, you may be allowed to go home when you can tolerate liquids and your pain is under control.   This information is not intended to replace advice given to you by your health care provider. Make sure you discuss any questions you have with your health care provider.   Document Released: 02/20/2001 Document Revised: 06/18/2014 Document Reviewed: 02/02/2013 Elsevier Interactive Patient Education [employer].

## 2015-06-07 NOTE — H&P (Signed)
Chief Complaint: Patient was seen in consultation today for lymphadenopathy at the request of Wert,Michael B  Referring Physician(s): Wert,Michael B  History of Present Illness: Kevin Ellison is a 81 y.o. male with metastatic disease of unknown primary who has been seen by Dr. Melvyn Novas on 05/24/15 and scheduled today for image guided left supraclavicular lymph node biopsy. He complains of worsening shortness of breath x 10 days with yellow productive sputum, he denies any fever or chills and states he was recently diagnosed with bronchitis. He denies any chest pain or palpitations. He denies any active signs of bleeding or excessive bruising. The patient denies any history of sleep apnea or chronic oxygen use. He has previously tolerated sedation without complications.    Past Medical History  Diagnosis Date  . Hyperlipidemia   . Hypertension   . Thyroid disease   . Prediabetes   . GERD (gastroesophageal reflux disease)     Past Surgical History  Procedure Laterality Date  . Cystoscopy  2002    Allergies: Ace inhibitors and Cardura  Medications: Prior to Admission medications   Medication Sig Start Date End Date Taking? Authorizing Provider  bisoprolol-hydrochlorothiazide [facility]) 10-6.25 MG per tablet TAKE ONE TABLET BY MOUTH EVERY MORNING FOR BLOOD PRESSURE 01/11/15  Yes Unk Pinto, MD  finasteride (PROSCAR) 5 MG tablet TAKE 1 TABLET BY MOUTH DAILY FOR PROSTATE 07/19/14  Yes Unk Pinto, MD  furosemide (LASIX) 40 MG tablet take 1 tablet by mouth daily for blood pressure and fluid 08/12/14  Yes Unk Pinto, MD  levothyroxine (SYNTHROID, LEVOTHROID) 50 MCG tablet TAKE ONE TABLET BY MOUTH ONCE DAILY BEFORE BREAKFAST 11/26/14  Yes Unk Pinto, MD  losartan (COZAAR) 100 MG tablet Take 1 tablet (100 mg total) by mouth daily. for blood pressure 03/30/15  Yes Unk Pinto, MD  meloxicam (MOBIC) 15 MG tablet TAKE ONE TABLET BY MOUTH ONE TIME DAILY AFTER A MEAL FOR ARTHRITIS  04/25/15  Yes Unk Pinto, MD  minoxidil (LONITEN) 10 MG tablet Take 1/4 to 1/2 to 1 tablet daily as directed for BP 01/31/14  Yes Unk Pinto, MD  pravastatin (PRAVACHOL) 40 MG tablet TAKE 1 TABLET BY MOUTH AT BEDTIME FOR CHOLESTEROL 01/29/15  Yes Unk Pinto, MD  tamsulosin (FLOMAX) 0.4 MG CAPS capsule TAKE ONE CAPSULE BY MOUTH ONE TIME DAILY 01/12/15  Yes Unk Pinto, MD  betamethasone dipropionate (DIPROLENE) 0.05 % ointment Apply topically 2 (two) times daily. 07/12/14   Unk Pinto, MD     Family History  Problem Relation Age of Onset  . Heart disease Mother   . Heart attack Mother   . Pneumonia Father   . Heart disease Sister     Social History   Social History  . Marital Status: Married    Spouse Name: N/A  . Number of Children: N/A  . Years of Education: N/A   Social History Main Topics  . Smoking status: Former Smoker -- 1.00 packs/day for 10 years    Types: Cigarettes    Quit date: 06/12/1987  . Smokeless tobacco: Former Systems developer    Quit date: 06/12/1987  . Alcohol Use: 1.2 oz/week    2 Standard drinks or equivalent per week     Comment: occasional beer  14 beers a week  . Drug Use: No  . Sexual Activity: Not Asked   Other Topics Concern  . None   Social History Narrative    Review of Systems: A 12 point ROS discussed and pertinent positives are indicated in  the HPI above.  All other systems are negative.  Review of Systems  Vital Signs: BP 145/87 mmHg  Pulse 87  Temp(Src) 97.5 F (36.4 C) (Oral)  Resp 28  SpO2 90%  Physical Exam  Constitutional: He is oriented to person, place, and time. No distress.  HENT:  Head: Normocephalic and atraumatic.  Cardiovascular: Normal rate and regular rhythm.  Exam reveals no gallop and no friction rub.   No murmur heard. Pulmonary/Chest:  Increased respiratory effort with exp rhonchi   Neurological: He is alert and oriented to person, place, and time.  Skin: Skin is warm and dry. He is not  diaphoretic.    Mallampati Score:  MD Evaluation Airway: WNL Heart: WNL Abdomen: WNL Chest/ Lungs: WNL ASA  Classification: 3 Mallampati/Airway Score: Two  Imaging: Dg Chest 2 View  05/11/2015  CLINICAL DATA:  Cough. Weight loss. Decreased breath sounds in the right lower lobe. Wheezing. EXAM: CHEST  2 VIEW COMPARISON:  None. FINDINGS: Normal heart size. Atherosclerotic aortic arch. Mildly thickened bilateral paratracheal stripes. Mildly prominent hila. No pneumothorax. Slight blunting of the right costophrenic angle, suggesting a trace right pleural effusion. No left pleural effusion. There are innumerable nodular opacities scattered throughout both lungs, most prominent in the lower lungs. No pulmonary edema. IMPRESSION: 1. Innumerable nodular opacities scattered throughout both lungs, most prominent in the lower lungs, highly suspicious for pulmonary metastases. 2. Mild thickening of the bilateral paratracheal stripe and mildly prominent hilar, which could indicate mediastinal/hilar adenopathy. 3. Possible trace right pleural effusion. 4. Recommend further evaluation with chest CT with IV contrast. These results will be called to the ordering clinician or representative by the Radiologist Assistant, and communication documented in the PACS or zVision Dashboard. Electronically Signed   By: Ilona Sorrel M.D.   On: 05/11/2015 16:15   Ct Chest W Contrast  05/17/2015  CLINICAL DATA:  Unexplained weight loss EXAM: CT CHEST, ABDOMEN, AND PELVIS WITH CONTRAST TECHNIQUE: Multidetector CT imaging of the chest, abdomen and pelvis was performed following the standard protocol during bolus administration of intravenous contrast. CONTRAST:  63m OMNIPAQUE IOHEXOL 300 MG/ML SOLN, 745mOMNIPAQUE IOHEXOL 300 MG/ML SOLN COMPARISON:  None. FINDINGS: CT CHEST FINDINGS Mediastinum/Nodes: Heart size appears normal. No pericardial effusion. The trachea appears patent and is midline. Normal appearance of the  esophagus. Aortic atherosclerosis noted. LAD, RCA and left circumflex coronary artery calcifications noted. Extensive thoracic adenopathy is identified. Left axillary lymph node measures 1.8 cm, image 12 of series 2. Left supraclavicular lymph node measures 1.2 cm, image 6 of series 2. Index right paratracheal lymph node measures 2.3 cm, image 21 of series 2. Index sub- carinal node measures 3.9 cm, image 30 of series 2. Lungs/Pleura: Small right pleural effusion. Numerous pulmonary nodules and masses are present throughout both lungs compatible with widespread pulmonary metastasis. Index lesion within the right upper lobe measures 4.1 cm, image 21 of series 4. Index lesion in the right lower lobe measures 2.8 cm, image 41 of series 4. The index lesion within the left upper lobe measures 2 cm, image 34 of series 4. Musculoskeletal: Degenerative disc disease noted within the thoracic spine. No aggressive lytic or sclerotic bone lesions identified. CT ABDOMEN PELVIS FINDINGS Hepatobiliary: There is a low attenuation structure along the dome of liver which measures 8 mm, image 51 of series 2. This is too small to characterize. Gallbladder is normal. No biliary dilatation. Pancreas: Normal appearance of the pancreas. Spleen: The spleen is negative. Adrenals/Urinary Tract: The right adrenal gland  is normal. There is a nodule in the left adrenal gland which measures 1.6 cm, image 61 of series 2. Bilateral renal cysts noted. The urinary bladder appears normal. Stomach/Bowel: The stomach and the small bowel loops have a normal course and caliber. The appendix is visualized and appears normal. Unremarkable appearance of the colon. Vascular/Lymphatic: Calcified atherosclerotic disease involves the abdominal aorta. No aneurysm. No enlarged retroperitoneal or mesenteric adenopathy. No enlarged pelvic or inguinal lymph nodes. Reproductive: Mild prostate gland enlargement. Other: No free fluid or fluid collections identified  within the upper abdomen. Musculoskeletal: No aggressive lytic or sclerotic bone lesions identified. All degenerative disc disease is identified within the lumbar spine. This is most advanced at the L5-S1 level. IMPRESSION: 1. Widespread pulmonary metastasis with numerous nodules and masses identified both lungs. 2. Extensive thoracic metastasis with bilateral mediastinal adenopathy, left supraclavicular adenopathy and left axillary adenopathy. 3. Right pleural effusion 4. Indeterminate nodule and left adrenal gland measures 1.6 cm. Cannot rule out metastatic disease. 5. Thoracic and lumbar degenerative disc disease. 6. Aortic atherosclerosis and multi vessel coronary artery calcification Electronically Signed   By: Kerby Moors M.D.   On: 05/17/2015 11:58   Ct Abdomen Pelvis W Contrast  05/17/2015  CLINICAL DATA:  Unexplained weight loss EXAM: CT CHEST, ABDOMEN, AND PELVIS WITH CONTRAST TECHNIQUE: Multidetector CT imaging of the chest, abdomen and pelvis was performed following the standard protocol during bolus administration of intravenous contrast. CONTRAST:  8m OMNIPAQUE IOHEXOL 300 MG/ML SOLN, 771mOMNIPAQUE IOHEXOL 300 MG/ML SOLN COMPARISON:  None. FINDINGS: CT CHEST FINDINGS Mediastinum/Nodes: Heart size appears normal. No pericardial effusion. The trachea appears patent and is midline. Normal appearance of the esophagus. Aortic atherosclerosis noted. LAD, RCA and left circumflex coronary artery calcifications noted. Extensive thoracic adenopathy is identified. Left axillary lymph node measures 1.8 cm, image 12 of series 2. Left supraclavicular lymph node measures 1.2 cm, image 6 of series 2. Index right paratracheal lymph node measures 2.3 cm, image 21 of series 2. Index sub- carinal node measures 3.9 cm, image 30 of series 2. Lungs/Pleura: Small right pleural effusion. Numerous pulmonary nodules and masses are present throughout both lungs compatible with widespread pulmonary metastasis. Index  lesion within the right upper lobe measures 4.1 cm, image 21 of series 4. Index lesion in the right lower lobe measures 2.8 cm, image 41 of series 4. The index lesion within the left upper lobe measures 2 cm, image 34 of series 4. Musculoskeletal: Degenerative disc disease noted within the thoracic spine. No aggressive lytic or sclerotic bone lesions identified. CT ABDOMEN PELVIS FINDINGS Hepatobiliary: There is a low attenuation structure along the dome of liver which measures 8 mm, image 51 of series 2. This is too small to characterize. Gallbladder is normal. No biliary dilatation. Pancreas: Normal appearance of the pancreas. Spleen: The spleen is negative. Adrenals/Urinary Tract: The right adrenal gland is normal. There is a nodule in the left adrenal gland which measures 1.6 cm, image 61 of series 2. Bilateral renal cysts noted. The urinary bladder appears normal. Stomach/Bowel: The stomach and the small bowel loops have a normal course and caliber. The appendix is visualized and appears normal. Unremarkable appearance of the colon. Vascular/Lymphatic: Calcified atherosclerotic disease involves the abdominal aorta. No aneurysm. No enlarged retroperitoneal or mesenteric adenopathy. No enlarged pelvic or inguinal lymph nodes. Reproductive: Mild prostate gland enlargement. Other: No free fluid or fluid collections identified within the upper abdomen. Musculoskeletal: No aggressive lytic or sclerotic bone lesions identified. All degenerative disc disease is  identified within the lumbar spine. This is most advanced at the L5-S1 level. IMPRESSION: 1. Widespread pulmonary metastasis with numerous nodules and masses identified both lungs. 2. Extensive thoracic metastasis with bilateral mediastinal adenopathy, left supraclavicular adenopathy and left axillary adenopathy. 3. Right pleural effusion 4. Indeterminate nodule and left adrenal gland measures 1.6 cm. Cannot rule out metastatic disease. 5. Thoracic and lumbar  degenerative disc disease. 6. Aortic atherosclerosis and multi vessel coronary artery calcification Electronically Signed   By: Kerby Moors M.D.   On: 05/17/2015 11:58   Nm Pet Image Initial (pi) Skull Base To Thigh  05/26/2015  CLINICAL DATA:  Initial treatment strategy for Lung cancer. EXAM: NUCLEAR MEDICINE PET SKULL BASE TO THIGH TECHNIQUE: 10.4 mCi F-18 FDG was injected intravenously. Full-ring PET imaging was performed from the skull base to thigh after the radiotracer. CT data was obtained and used for attenuation correction and anatomic localization. FASTING BLOOD GLUCOSE:  Value: 109 mg/dl COMPARISON:  None FINDINGS: NECK Hypermetabolic left level 4 lymph node measures 1.2 cm and has an SUV max equal to 7.7, image 41 of series 4. CHEST At the level of the thoracic inlet there are bilateral hypermetabolic lymph nodes. Index node measures 1 cm and has an SUV max equal to 16.7. On the left lymph node measures 1.3 cm and has an SUV max equal to 13.75, image 49 of series 4. Left axillary node measures 1.5 cm and has an SUV max equal to 16.89, image 60 of series 4. Extensive bilateral hypermetabolic and enlarged mediastinal and hilar adenopathy. Index right paratracheal lymph node measures 2.4 cm and has an SUV max equal to 9.6, image 66 of series 4. Pre-vascular lymph node along the left side of the transverse aortic arch measures 1.6 cm and has an SUV max equal to 23 0.7, image 70 of series 4. Large sub- carinal lymph node measures 3.6 cm and has an SUV max equal to 18.76, image 80 of series 4. Bilateral pleural effusions are identified right greater than left. There is evidence of trans pleural spread of tumor involving both hemi thoraces. Hypermetabolic tumor along the posterior right costophrenic sulcus has an SUV max equal to 10.3, image 125 of the PET images. Hypermetabolic pleural tumor along the posterior costophrenic sulcus has an SUV max equal to 10.7, image 107. Innumerable hypermetabolic  pulmonary nodules are scattered throughout both lungs. These are too numerous to count. Index lesion within the right upper lobe measures 4 cm and has an SUV max equal to 18.5, image 69 of series 4. Index nodule within the left upper lobe measures 1.9 cm and has an SUV max equal to 13.16. ABDOMEN/PELVIS Nodule in the left adrenal gland measures 1.7 cm and 11.6 Hounsfield units. SUV max is equal to 3.2, image 116 of series 4. No abnormal scratch set no mass or nodule within the right adrenal gland. No abnormal uptake identified within the liver, pancreas, or spleen. No hypermetabolic lymph nodes within the upper abdomen. No hypermetabolic pelvic adenopathy. SKELETON Hypermetabolic lesion within the left iliac bone is identified. This measures 2.8 cm and has an SUV max equal to 16.1. IMPRESSION: 1. Extensive pulmonary metastatic disease is identified throughout both lungs. Primary site of disease within the chest is un known. Assuming non-small cell histology findings would be consistent with a [id number] lesion or stage IV disease. 2. Left cervical, bilateral mediastinal and bilateral hilar, and left axillary lymph node metastasis. 3. Evidence of trans pleural spread of tumor involving both hemithoraces. 4. Hypermetabolic  bone metastasis within left iliac bone. 5. Indeterminate left adrenal nodule exhibits low level FDG uptake. Electronically Signed   By: Kerby Moors M.D.   On: 05/26/2015 14:54    Labs:  CBC:  Recent Labs  12/17/14 1106 04/01/15 1104 05/11/15 1520 06/07/15 1130  WBC 8.8 11.7* 15.2* 16.1*  HGB 15.2 14.6 13.5 13.6  HCT 45.1 42.4 40.3 42.0  PLT 280 289 368 319    COAGS:  Recent Labs  06/07/15 1130  INR 1.21    BMP:  Recent Labs  12/17/14 1106 04/01/15 1104 05/11/15 1520 06/07/15 1130  NA 142 140 136 139  K 4.2 4.4 4.1 4.0  CL 101 102 100 101  CO2 '29 30 26 28  '$ GLUCOSE 127* 99 86 121*  BUN '14 16 15 16  '$ CALCIUM 8.5 8.4* 8.2* 8.5*  CREATININE 0.85 0.92 1.00 0.98    GFRNONAA 83 79 71 >60  GFRAA >89 >89 82 >60    LIVER FUNCTION TESTS:  Recent Labs  09/09/14 1256 12/17/14 1106 04/01/15 1104 05/11/15 1520  BILITOT 0.9 0.9 1.0 0.9  AST '19 19 13 12  '$ ALT '19 21 9 '$ 8*  ALKPHOS 56 62 55 54  PROT 6.3 6.6 6.4 6.0*  ALBUMIN 4.0 4.1 3.6 3.2*    Assessment and Plan: Metastatic disease of unknown primary Seen by Dr. Melvyn Novas on 05/24/15 Scheduled today for image guided left supraclavicular lymph node biopsy with sedation The patient has been NPO, no blood thinners taken, labs and vitals have been reviewed. Risks and Benefits discussed with the patient including, but not limited to bleeding, infection, damage to adjacent structures or low yield requiring additional tests. All of the patient's questions were answered, patient is agreeable to proceed. Consent signed and in chart.   Thank you for this interesting consult.  I greatly enjoyed meeting TRAE BOVENZI and look forward to participating in their care.  A copy of this report was sent to the requesting provider on this date.  SignedHedy Jacob 06/07/2015, 12:14 PM   I spent a total of 15 Minutes in face to face in clinical consultation, greater than 50% of which was counseling/coordinating care for lymphadenopathy

## 2015-06-07 NOTE — Procedures (Signed)
Interventional Radiology Procedure Note  Procedure:  Ultrasound guided core biopsy of left supraclavicular lymph node  Complications:  None  Estimated Blood Loss: < 10 mL  18 G core biopsy x 4 of 2 cm left supraclavicular lymph node. No complications.   Venetia Night. Kathlene Cote, M.D Pager:  [phone]

## 2015-06-09 ENCOUNTER — Inpatient Hospital Stay (HOSPITAL_COMMUNITY): Payer: Medicare Other

## 2015-06-09 ENCOUNTER — Inpatient Hospital Stay (HOSPITAL_COMMUNITY)
Admission: EM | Admit: 2015-06-09 | Discharge: 2015-06-11 | DRG: 180 | Disposition: A | Discharge status: Hospice | Payer: Medicare Other | Attending: Internal Medicine | Admitting: Internal Medicine

## 2015-06-09 ENCOUNTER — Encounter (HOSPITAL_COMMUNITY): Payer: Self-pay | Admitting: Emergency Medicine

## 2015-06-09 ENCOUNTER — Emergency Department (HOSPITAL_COMMUNITY): Payer: Medicare Other

## 2015-06-09 DIAGNOSIS — F419 Anxiety disorder, unspecified: Secondary | ICD-10-CM | POA: Diagnosis present

## 2015-06-09 DIAGNOSIS — C801 Malignant (primary) neoplasm, unspecified: Secondary | ICD-10-CM | POA: Diagnosis not present

## 2015-06-09 DIAGNOSIS — R7303 Prediabetes: Secondary | ICD-10-CM | POA: Diagnosis present

## 2015-06-09 DIAGNOSIS — Z66 Do not resuscitate: Secondary | ICD-10-CM | POA: Diagnosis not present

## 2015-06-09 DIAGNOSIS — Z87891 Personal history of nicotine dependence: Secondary | ICD-10-CM | POA: Diagnosis not present

## 2015-06-09 DIAGNOSIS — R591 Generalized enlarged lymph nodes: Secondary | ICD-10-CM | POA: Diagnosis present

## 2015-06-09 DIAGNOSIS — Z9889 Other specified postprocedural states: Secondary | ICD-10-CM

## 2015-06-09 DIAGNOSIS — J9601 Acute respiratory failure with hypoxia: Secondary | ICD-10-CM | POA: Diagnosis present

## 2015-06-09 DIAGNOSIS — C799 Secondary malignant neoplasm of unspecified site: Secondary | ICD-10-CM

## 2015-06-09 DIAGNOSIS — G4700 Insomnia, unspecified: Secondary | ICD-10-CM | POA: Diagnosis present

## 2015-06-09 DIAGNOSIS — Z515 Encounter for palliative care: Secondary | ICD-10-CM | POA: Diagnosis present

## 2015-06-09 DIAGNOSIS — I10 Essential (primary) hypertension: Secondary | ICD-10-CM | POA: Diagnosis present

## 2015-06-09 DIAGNOSIS — C778 Secondary and unspecified malignant neoplasm of lymph nodes of multiple regions: Secondary | ICD-10-CM

## 2015-06-09 DIAGNOSIS — C3490 Malignant neoplasm of unspecified part of unspecified bronchus or lung: Secondary | ICD-10-CM | POA: Diagnosis not present

## 2015-06-09 DIAGNOSIS — E785 Hyperlipidemia, unspecified: Secondary | ICD-10-CM | POA: Diagnosis present

## 2015-06-09 DIAGNOSIS — E039 Hypothyroidism, unspecified: Secondary | ICD-10-CM | POA: Diagnosis present

## 2015-06-09 DIAGNOSIS — J90 Pleural effusion, not elsewhere classified: Secondary | ICD-10-CM

## 2015-06-09 DIAGNOSIS — R0602 Shortness of breath: Secondary | ICD-10-CM | POA: Diagnosis not present

## 2015-06-09 DIAGNOSIS — K219 Gastro-esophageal reflux disease without esophagitis: Secondary | ICD-10-CM | POA: Diagnosis present

## 2015-06-09 DIAGNOSIS — Z789 Other specified health status: Secondary | ICD-10-CM | POA: Diagnosis not present

## 2015-06-09 DIAGNOSIS — J910 Malignant pleural effusion: Secondary | ICD-10-CM | POA: Diagnosis present

## 2015-06-09 DIAGNOSIS — R0600 Dyspnea, unspecified: Secondary | ICD-10-CM | POA: Diagnosis not present

## 2015-06-09 DIAGNOSIS — J948 Other specified pleural conditions: Secondary | ICD-10-CM

## 2015-06-09 DIAGNOSIS — D72829 Elevated white blood cell count, unspecified: Secondary | ICD-10-CM | POA: Diagnosis not present

## 2015-06-09 DIAGNOSIS — C7972 Secondary malignant neoplasm of left adrenal gland: Secondary | ICD-10-CM

## 2015-06-09 DIAGNOSIS — N400 Enlarged prostate without lower urinary tract symptoms: Secondary | ICD-10-CM | POA: Diagnosis present

## 2015-06-09 DIAGNOSIS — C7951 Secondary malignant neoplasm of bone: Secondary | ICD-10-CM | POA: Diagnosis present

## 2015-06-09 DIAGNOSIS — C7802 Secondary malignant neoplasm of left lung: Secondary | ICD-10-CM | POA: Diagnosis not present

## 2015-06-09 DIAGNOSIS — Z22322 Carrier or suspected carrier of Methicillin resistant Staphylococcus aureus: Secondary | ICD-10-CM

## 2015-06-09 DIAGNOSIS — R0603 Acute respiratory distress: Secondary | ICD-10-CM | POA: Diagnosis present

## 2015-06-09 DIAGNOSIS — Z8249 Family history of ischemic heart disease and other diseases of the circulatory system: Secondary | ICD-10-CM | POA: Diagnosis not present

## 2015-06-09 DIAGNOSIS — C7801 Secondary malignant neoplasm of right lung: Secondary | ICD-10-CM

## 2015-06-09 DIAGNOSIS — C770 Secondary and unspecified malignant neoplasm of lymph nodes of head, face and neck: Secondary | ICD-10-CM | POA: Diagnosis present

## 2015-06-09 DIAGNOSIS — R000 Tachycardia, unspecified: Secondary | ICD-10-CM | POA: Diagnosis present

## 2015-06-09 DIAGNOSIS — C773 Secondary and unspecified malignant neoplasm of axilla and upper limb lymph nodes: Secondary | ICD-10-CM | POA: Diagnosis present

## 2015-06-09 DIAGNOSIS — C771 Secondary and unspecified malignant neoplasm of intrathoracic lymph nodes: Secondary | ICD-10-CM | POA: Diagnosis present

## 2015-06-09 DIAGNOSIS — J9621 Acute and chronic respiratory failure with hypoxia: Secondary | ICD-10-CM

## 2015-06-09 DIAGNOSIS — C7800 Secondary malignant neoplasm of unspecified lung: Secondary | ICD-10-CM | POA: Diagnosis not present

## 2015-06-09 HISTORY — DX: Malignant (primary) neoplasm, unspecified: C80.1

## 2015-06-09 LAB — GRAM STAIN

## 2015-06-09 LAB — CBC
HEMATOCRIT: 40.8 % (ref 39.0–52.0)
HEMOGLOBIN: 13.7 g/dL (ref 13.0–17.0)
MCH: 28.2 pg (ref 26.0–34.0)
MCHC: 33.6 g/dL (ref 30.0–36.0)
MCV: 84 fL (ref 78.0–100.0)
Platelets: 314 10*3/uL (ref 150–400)
RBC: 4.86 MIL/uL (ref 4.22–5.81)
RDW: 14.7 % (ref 11.5–15.5)
WBC: 16.3 10*3/uL — ABNORMAL HIGH (ref 4.0–10.5)

## 2015-06-09 LAB — CHOLESTEROL, TOTAL: Cholesterol: 110 mg/dL (ref 0–200)

## 2015-06-09 LAB — COMPREHENSIVE METABOLIC PANEL
ALBUMIN: 2.8 g/dL — AB (ref 3.5–5.0)
ALK PHOS: 61 U/L (ref 38–126)
ALT: 12 U/L — AB (ref 17–63)
AST: 13 U/L — AB (ref 15–41)
Anion gap: 10 (ref 5–15)
BILIRUBIN TOTAL: 0.8 mg/dL (ref 0.3–1.2)
BUN: 16 mg/dL (ref 6–20)
CALCIUM: 8.3 mg/dL — AB (ref 8.9–10.3)
CO2: 27 mmol/L (ref 22–32)
CREATININE: 1.01 mg/dL (ref 0.61–1.24)
Chloride: 101 mmol/L (ref 101–111)
GFR calc Af Amer: >60 mL/min (ref >60)
GFR calc non Af Amer: >60 mL/min (ref >60)
GLUCOSE: 117 mg/dL — AB (ref 65–99)
Potassium: 3.9 mmol/L (ref 3.5–5.1)
Sodium: 138 mmol/L (ref 135–145)
TOTAL PROTEIN: 6.3 g/dL — AB (ref 6.5–8.1)

## 2015-06-09 LAB — GLUCOSE, SEROUS FLUID: Glucose, Fluid: 130 mg/dL

## 2015-06-09 LAB — BODY FLUID CELL COUNT WITH DIFFERENTIAL
EOS FL: 4 %
LYMPHS FL: 62 %
MONOCYTE-MACROPHAGE-SEROUS FLUID: 13 % — AB (ref 50–90)
Neutrophil Count, Fluid: 21 % (ref 0–25)
Total Nucleated Cell Count, Fluid: 776 cu mm (ref 0–1000)

## 2015-06-09 LAB — PROTEIN, BODY FLUID: TOTAL PROTEIN, FLUID: 3.2 g/dL

## 2015-06-09 LAB — TROPONIN I: Troponin I: 0.04 ng/mL — ABNORMAL HIGH (ref <0.031)

## 2015-06-09 LAB — I-STAT CG4 LACTIC ACID, ED: Lactic Acid, Venous: 1.59 mmol/L (ref 0.5–2.0)

## 2015-06-09 LAB — PROTEIN, TOTAL: TOTAL PROTEIN: 6 g/dL — AB (ref 6.5–8.1)

## 2015-06-09 LAB — MRSA PCR SCREENING: MRSA by PCR: POSITIVE — AB

## 2015-06-09 LAB — INFLUENZA PANEL BY PCR (TYPE A & B)
H1N1FLUPCR: NOT DETECTED
INFLBPCR: NEGATIVE
Influenza A By PCR: NEGATIVE

## 2015-06-09 LAB — LACTATE DEHYDROGENASE: LDH: 240 U/L — AB (ref 98–192)

## 2015-06-09 LAB — LACTATE DEHYDROGENASE, PLEURAL OR PERITONEAL FLUID: LD FL: 299 U/L — AB (ref 3–23)

## 2015-06-09 MED ORDER — ALBUTEROL SULFATE (2.5 MG/3ML) 0.083% IN NEBU
5.0000 mg | INHALATION_SOLUTION | Freq: Once | RESPIRATORY_TRACT | Status: AC
  Administered 2015-06-09: 5 mg via RESPIRATORY_TRACT
  Filled 2015-06-09: qty 6

## 2015-06-09 MED ORDER — IPRATROPIUM BROMIDE 0.02 % IN SOLN
0.5000 mg | Freq: Once | RESPIRATORY_TRACT | Status: AC
  Administered 2015-06-09: 0.5 mg via RESPIRATORY_TRACT
  Filled 2015-06-09: qty 2.5

## 2015-06-09 MED ORDER — DEXTROSE 5 % IV SOLN
1.0000 g | Freq: Once | INTRAVENOUS | Status: AC
  Administered 2015-06-09: 1 g via INTRAVENOUS
  Filled 2015-06-09: qty 10

## 2015-06-09 MED ORDER — LEVOTHYROXINE SODIUM 50 MCG PO TABS: 50.0000 ug | ORAL_TABLET | Freq: Every day | ORAL | Status: DC

## 2015-06-09 MED ORDER — FUROSEMIDE 40 MG PO TABS
40.0000 mg | ORAL_TABLET | Freq: Every day | ORAL | Status: DC
  Administered 2015-06-10 – 2015-06-11 (×2): 40 mg via ORAL
  Filled 2015-06-09 (×2): qty 1

## 2015-06-09 MED ORDER — LEVALBUTEROL HCL 1.25 MG/0.5ML IN NEBU
1.2500 mg | INHALATION_SOLUTION | Freq: Four times a day (QID) | RESPIRATORY_TRACT | Status: DC
  Administered 2015-06-09: 1.25 mg via RESPIRATORY_TRACT
  Filled 2015-06-09: qty 0.5

## 2015-06-09 MED ORDER — MELOXICAM 7.5 MG PO TABS
7.5000 mg | ORAL_TABLET | Freq: Every day | ORAL | Status: DC
  Administered 2015-06-09 – 2015-06-11 (×3): 7.5 mg via ORAL
  Filled 2015-06-09 (×3): qty 1

## 2015-06-09 MED ORDER — BISOPROLOL-HYDROCHLOROTHIAZIDE 10-6.25 MG PO TABS
1.0000 | ORAL_TABLET | Freq: Every day | ORAL | Status: DC
  Administered 2015-06-09 – 2015-06-11 (×3): 1 via ORAL
  Filled 2015-06-09 (×3): qty 1

## 2015-06-09 MED ORDER — GUAIFENESIN ER 600 MG PO TB12: 600.0000 mg | ORAL_TABLET | Freq: Every day | ORAL | Status: DC | PRN

## 2015-06-09 MED ORDER — LORAZEPAM 2 MG/ML IJ SOLN
0.5000 mg | Freq: Once | INTRAMUSCULAR | Status: AC
  Administered 2015-06-09: 0.5 mg via INTRAVENOUS
  Filled 2015-06-09: qty 1

## 2015-06-09 MED ORDER — ZOLPIDEM TARTRATE 5 MG PO TABS: 5.0000 mg | ORAL_TABLET | Freq: Every evening | ORAL | Status: DC | PRN

## 2015-06-09 MED ORDER — TRAZODONE HCL 50 MG PO TABS
50.0000 mg | ORAL_TABLET | Freq: Every day | ORAL | Status: DC
  Administered 2015-06-09 – 2015-06-10 (×2): 50 mg via ORAL
  Filled 2015-06-09 (×2): qty 1

## 2015-06-09 MED ORDER — LEVALBUTEROL HCL 1.25 MG/0.5ML IN NEBU
1.2500 mg | INHALATION_SOLUTION | Freq: Four times a day (QID) | RESPIRATORY_TRACT | Status: DC
  Filled 2015-06-09: qty 0.5

## 2015-06-09 MED ORDER — MINOXIDIL 2.5 MG PO TABS
5.0000 mg | ORAL_TABLET | Freq: Every day | ORAL | Status: DC
  Administered 2015-06-09 – 2015-06-11 (×3): 5 mg via ORAL
  Filled 2015-06-09 (×3): qty 2

## 2015-06-09 MED ORDER — MORPHINE SULFATE (PF) 2 MG/ML IV SOLN
2.0000 mg | INTRAVENOUS | Status: DC | PRN
  Administered 2015-06-10: 2 mg via INTRAVENOUS
  Filled 2015-06-09: qty 1

## 2015-06-09 MED ORDER — TAMSULOSIN HCL 0.4 MG PO CAPS
0.4000 mg | ORAL_CAPSULE | Freq: Every day | ORAL | Status: DC
  Administered 2015-06-09 – 2015-06-11 (×3): 0.4 mg via ORAL
  Filled 2015-06-09 (×3): qty 1

## 2015-06-09 MED ORDER — HYDROCODONE-ACETAMINOPHEN 10-325 MG PO TABS
1.0000 | ORAL_TABLET | Freq: Every day | ORAL | Status: DC
  Administered 2015-06-09 – 2015-06-10 (×2): 1 via ORAL
  Filled 2015-06-09 (×2): qty 1

## 2015-06-09 MED ORDER — PRAVASTATIN SODIUM 20 MG PO TABS
40.0000 mg | ORAL_TABLET | Freq: Every day | ORAL | Status: DC
  Administered 2015-06-09: 40 mg via ORAL
  Filled 2015-06-09: qty 2

## 2015-06-09 MED ORDER — AZITHROMYCIN 500 MG IV SOLR
500.0000 mg | Freq: Once | INTRAVENOUS | Status: AC
  Administered 2015-06-09: 500 mg via INTRAVENOUS
  Filled 2015-06-09: qty 500

## 2015-06-09 MED ORDER — DIPHENHYDRAMINE-APAP (SLEEP) 25-500 MG PO TABS: 2.0000 | ORAL_TABLET | Freq: Every evening | ORAL | Status: DC | PRN

## 2015-06-09 MED ORDER — ALBUTEROL SULFATE (2.5 MG/3ML) 0.083% IN NEBU
5.0000 mg | INHALATION_SOLUTION | Freq: Once | RESPIRATORY_TRACT | Status: AC
Start: 2015-06-09 — End: 2015-06-09
  Administered 2015-06-09: 5 mg via RESPIRATORY_TRACT
  Filled 2015-06-09: qty 6

## 2015-06-09 MED ORDER — DEXTROSE 5 % IV SOLN
500.0000 mg | INTRAVENOUS | Status: DC
  Administered 2015-06-10: 500 mg via INTRAVENOUS
  Filled 2015-06-09: qty 500

## 2015-06-09 MED ORDER — DEXTROSE 5 % IV SOLN
1.0000 g | INTRAVENOUS | Status: DC
  Administered 2015-06-10: 1 g via INTRAVENOUS
  Filled 2015-06-09: qty 10

## 2015-06-09 MED ORDER — FINASTERIDE 5 MG PO TABS
5.0000 mg | ORAL_TABLET | Freq: Every day | ORAL | Status: DC
  Administered 2015-06-09 – 2015-06-11 (×3): 5 mg via ORAL
  Filled 2015-06-09 (×3): qty 1

## 2015-06-09 MED ORDER — MUPIROCIN 2 % EX OINT
1.0000 "application " | TOPICAL_OINTMENT | Freq: Two times a day (BID) | CUTANEOUS | Status: DC
  Administered 2015-06-10 – 2015-06-11 (×3): 1 via NASAL
  Filled 2015-06-09: qty 22

## 2015-06-09 MED ORDER — LEVALBUTEROL HCL 1.25 MG/0.5ML IN NEBU: 1.2500 mg | INHALATION_SOLUTION | Freq: Four times a day (QID) | RESPIRATORY_TRACT | Status: DC | PRN

## 2015-06-09 MED ORDER — SODIUM CHLORIDE 0.9 % IV SOLN
INTRAVENOUS | Status: DC
  Administered 2015-06-09: 11:00:00 via INTRAVENOUS

## 2015-06-09 MED ORDER — CHLORHEXIDINE GLUCONATE CLOTH 2 % EX PADS
6.0000 | MEDICATED_PAD | Freq: Every day | CUTANEOUS | Status: DC
  Administered 2015-06-10 – 2015-06-11 (×2): 6 via TOPICAL

## 2015-06-09 MED ORDER — LORAZEPAM 2 MG/ML IJ SOLN
0.5000 mg | INTRAMUSCULAR | Status: DC | PRN
  Administered 2015-06-10 (×3): 0.5 mg via INTRAVENOUS
  Filled 2015-06-09 (×3): qty 1

## 2015-06-09 MED ORDER — ENOXAPARIN SODIUM 40 MG/0.4ML ~~LOC~~ SOLN
40.0000 mg | SUBCUTANEOUS | Status: DC
  Administered 2015-06-09: 40 mg via SUBCUTANEOUS
  Filled 2015-06-09: qty 0.4

## 2015-06-09 MED ORDER — IPRATROPIUM BROMIDE 0.02 % IN SOLN
0.5000 mg | Freq: Four times a day (QID) | RESPIRATORY_TRACT | Status: DC
  Administered 2015-06-09: 0.5 mg via RESPIRATORY_TRACT
  Filled 2015-06-09 (×2): qty 2.5

## 2015-06-09 MED ORDER — LOSARTAN POTASSIUM 50 MG PO TABS
100.0000 mg | ORAL_TABLET | Freq: Every day | ORAL | Status: DC
  Administered 2015-06-09 – 2015-06-11 (×3): 100 mg via ORAL
  Filled 2015-06-09 (×3): qty 2

## 2015-06-09 MED ORDER — IPRATROPIUM BROMIDE 0.02 % IN SOLN: 0.5000 mg | Freq: Four times a day (QID) | RESPIRATORY_TRACT | Status: DC | PRN

## 2015-06-09 NOTE — Progress Notes (Signed)
Palliative Consult received. I reviewed patients chart in detail and spoke with him at length this evening about his condition. He desires a full comfort care approach to his metastatic lung cancer. He does not desire systemic chemotherapy or interventions that may adversely affect the QOL he currently has. His desire is to live out his remaining time as well as possible and to let nature "take its course". He tells me he has lived almost 11 years and that is enough for him. He shared with me his life successes and many losses-including the premature death of his daughter Kevin Ellison who was a physician here in [place]- death believed to be self-inflicted and the pain that he has felt since that loss. He wants comfort, his goal tonight is to sleep and to have minimal interventions. He wants to go home with hospice care. I have added on comfort meds and discussed goals and plans with [name] his RN this evening- meds and minimal interruptions. Will follow up tomorrow AM.  Lane Hacker, DO Palliative Medicine

## 2015-06-09 NOTE — Consult Note (Signed)
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 Presents to the ED w/ 2wk h/o progressive dyspnea now to the point that his is in acute distress. On ED evaluation found to have large to mod right effusion and wide spread parenchymal involvement of the lung. PCCM was asked to evaluate given the effusion   SIGNIFICANT EVENTS    STUDIES:  PET scan 12/15: 1. Extensive pulmonary metastatic disease is identified throughout both lungs.  a [id number] lesion or stage IV disease. Left cervical, bilateral mediastinal and bilateral hilar, and left axillary lymph node metastasis.3. Evidence of trans pleural spread of tumor involving both hemithoraces.4. Hypermetabolic bone metastasis within left iliac bone.5. Indeterminate left adrenal nodule exhibits low level FDG uptake. 12/27 surgical path: Lymph node, needle/core biopsy, left supraclavicular. POORLY DIFFERENTIATED CARCINOMA CONSISTENT WITH ADENOCARCINOMA OF THE LUNG PRIMARY.    HISTORY OF PRESENT ILLNESS:   See above  PAST MEDICAL HISTORY :   has a past medical history of Hyperlipidemia; Hypertension; Thyroid disease; Prediabetes; GERD (gastroesophageal reflux disease); and Cancer ([place]).  has past surgical history that includes Cystoscopy (2002). Prior to Admission medications   Medication Sig Start Date End Date Taking? Authorizing Provider  bisoprolol-hydrochlorothiazide (ZIAC) 10-6.25 MG per tablet TAKE ONE TABLET BY MOUTH EVERY MORNING FOR BLOOD PRESSURE 01/11/15  Yes Unk Pinto, MD  diphenhydramine-acetaminophen (TYLENOL PM) 25-500 MG TABS tablet Take 2 tablets by mouth at bedtime as needed (for sleep).   Yes Historical Provider, MD  finasteride (PROSCAR) 5 MG tablet TAKE 1 TABLET BY MOUTH DAILY FOR PROSTATE  07/19/14  Yes Unk Pinto, MD  furosemide (LASIX) 40 MG tablet take 1 tablet by mouth daily for blood pressure and fluid 08/12/14  Yes Unk Pinto, MD  guaiFENesin (MUCINEX) 600 MG 12 hr tablet Take 600 mg by mouth daily as needed for cough or to loosen phlegm.   Yes Historical Provider, MD  levothyroxine (SYNTHROID, LEVOTHROID) 50 MCG tablet TAKE ONE TABLET BY MOUTH ONCE DAILY BEFORE BREAKFAST 11/26/14  Yes Unk Pinto, MD  losartan (COZAAR) 100 MG tablet Take 1 tablet (100 mg total) by mouth daily. for blood pressure 03/30/15  Yes Unk Pinto, MD  meloxicam (MOBIC) 15 MG tablet TAKE ONE TABLET BY MOUTH ONE TIME DAILY AFTER A MEAL FOR ARTHRITIS 04/25/15  Yes Unk Pinto, MD  minoxidil (LONITEN) 10 MG tablet Take 1/4 to 1/2 to 1 tablet daily as directed for BP Patient taking differently: Take 5 mg by mouth daily.  01/31/14  Yes Unk Pinto, MD  pravastatin (PRAVACHOL) 40 MG tablet TAKE 1 TABLET BY MOUTH AT BEDTIME FOR CHOLESTEROL 01/29/15  Yes Unk Pinto, MD  tamsulosin (FLOMAX) 0.4 MG CAPS capsule TAKE ONE CAPSULE BY MOUTH ONE TIME DAILY 01/12/15  Yes Unk Pinto, MD  betamethasone dipropionate (DIPROLENE) 0.05 % ointment Apply topically 2 (two) times daily. Patient not taking: Reported on 06/09/2015 07/12/14   Unk Pinto, MD   Allergies  Allergen Reactions  . Cardura [Doxazosin Mesylate]     Unknown reaction   . Ace Inhibitors Cough    FAMILY HISTORY:  family history includes Heart attack in his mother; Heart disease in his mother and sister; Pneumonia in his father. SOCIAL HISTORY:  reports that he quit smoking about 28 years ago. His smoking use  included Cigarettes. He has a 10 pack-year smoking history. He quit smokeless tobacco use about 28 years ago. He reports that he drinks about 1.2 oz of alcohol per week. He reports that he does not use illicit drugs.  REVIEW OF SYSTEMS:   Constitutional: Negative for fever, chills, weight loss, +fatigue and  diaphoresis.  HENT: Negative for hearing loss, ear pain, nosebleeds, congestion, sore throat, neck pain, tinnitus and ear discharge.   Eyes: Negative for blurred vision, double vision, photophobia, pain, discharge and redness.  Respiratory: Negative for cough, hemoptysis, sputum production,+++ shortness of breath, this has progressed specifically over the last 2 weeks but likely started shortly after the diagnosis about 4 weeks ago. Prior to this he was driving and ambulatory, he has had such significant shortness of breath he can not lay down to sleep. Denies chest discomfort. , wheezing and stridor.   Cardiovascular: Negative for chest pain, palpitations, orthopnea, claudication, leg swelling and PND.  Gastrointestinal: Negative for heartburn, nausea, vomiting, abdominal pain, diarrhea, constipation, blood in stool and melena. has not been able to eat "nothing tastes good", little oral intake over last 4-6 days Genitourinary: Negative for dysuria, urgency, frequency, hematuria and flank pain.  Musculoskeletal: Negative for myalgias, back pain, joint pain and falls.  Skin: Negative for itching and rash.  Neurological: Negative for dizziness, tingling, tremors, sensory change, speech change, focal weakness, seizures, loss of consciousness, weakness and headaches.  Endo/Heme/Allergies: Negative for environmental allergies and polydipsia. Does not bruise/bleed easily.  SUBJECTIVE:  In significant distress.  VITAL SIGNS: Temp:  [98.9 F (37.2 C)] 98.9 F (37.2 C) (12/29 1253) Pulse Rate:  [89-124] 124 (12/29 1330) Resp:  [26-37] 27 (12/29 1330) BP: (140-158)/(84-95) 155/88 mmHg (12/29 1330) SpO2:  [90 %-95 %] 93 % (12/29 1330)  PHYSICAL EXAMINATION: General:  81 year old white male, currently in acute respiratory distress. Short of breath at rest, talks in 2-3 word phrases  Neuro:  Awake, oriented, no focal def  HEENT:  NCAT, his MM are dry and cracked  Cardiovascular:  Tachy rrr no MRG    Lungs:  Diffuse rhonchi, marked accessory muscle use w/ paradoxical respiratory efforts. Decreased on the right  Abdomen:  Soft, not tender + bowel sounds no OM  Musculoskeletal:  Weak, equal st and bulk  Skin:  Dry, warm and intact    Recent Labs Lab 06/07/15 1130 06/09/15 1110  NA 139 138  K 4.0 3.9  CL 101 101  CO2 28 27  BUN 16 16  CREATININE 0.98 1.01  GLUCOSE 121* 117*    Recent Labs Lab 06/07/15 1130 06/09/15 1110  HGB 13.6 13.7  HCT 42.0 40.8  WBC 16.1* 16.3*  PLT 319 314   Dg Chest 2 View  06/09/2015  CLINICAL DATA:  Metastatic lung carcinoma. Shortness of breath, progressive over past week EXAM: CHEST  2 VIEW COMPARISON:  Chest radiograph May 11, 2015; chest CT May 17, 2015 FINDINGS: There is a moderate pleural effusion on the right, slightly larger compared to most recent study. There is compressive atelectasis in the right base. There is widespread pulmonary metastatic disease with nodular lesions throughout both lungs, not appreciably changed. The heart is upper normal in size with pulmonary vascularity within normal limits. Adenopathy is present, better seen on CT examination. There is degenerative change in the thoracic spine with diffuse idiopathic skeletal hyperostosis. There are no blastic or lytic bone lesions appreciable radiographically. IMPRESSION: Widespread parenchymal metastatic disease. Moderate right pleural effusion, larger compared to most recent  prior study. Compressive atelectasis on the right noted. No change in cardiac silhouette. Adenopathy is better seen on CT than on radiographic examination. There is diffuse idiopathic skeletal hyperostosis in the thoracic spine. Electronically Signed   By: Lowella Grip III M.D.   On: 06/09/2015 12:06    ASSESSMENT / PLAN: 1) Acute Respiratory failure in the setting of what is likely mod-->large right malignant pleural effusion and progressive and wide spread metastatic disease of the lung.  2)  Widely metastatic adenocarcinoma of the lung.  3) GERD/DM/hypothyroidism   We had a long discussion about his cancer. His wishes, and his current symptom burden. He is not interested in treatment for his cancer in the form of chemo or XRT. His largest concern is for his daily symptom burden and that he be comfortable. I informed him that the likelihood that the effusion is malignant is very high, and that we home that draining this may help w/ his dyspnea, however if not and this is primarily from the parenchymal involvement then we would still have palliative medication such as morphine and narcotics to help assist w/ dyspnea treatment. If he gets significant symptom relieft from the effusion AND indeed the effusion is malignant AND it re-occurs which it almost certainly will then the next step would be to consider pleur-x drain. We have discussed all of these options at length with the patient AND his family. Based on our discussion AND the patients clear wishes we have also talked about Code status. Based on our discussion we have agreed to DNR/DNI status.   Plan He will go to IR for diagnostic and therapeutic thoracentesis  We have ordered pleural fluid diagnostics IF effusion is indeed malignant AND the effusion returns; would recommend consideration for pleur-x cath.  Will put in palliative care consult  Full DNR abx as ordered are reasonable but doubt he is infected   06/09/2015, 2:08 PM

## 2015-06-09 NOTE — Procedures (Signed)
Successful US guided right thoracentesis. Yielded 1.5 liters of amber colored fluid. Pt tolerated procedure well. No immediate complications.  Specimen was sent for labs. CXR ordered.  Tsosie Billing D PA-C 06/09/2015 4:04 PM

## 2015-06-09 NOTE — ED Notes (Signed)
Pt with hx of recent dx of lung cancer c/o SOB onset last week, difficulty sleeping onset last week. Lung sounds diminished all fields, no lung sounds present right lower lung fields. Labor of breathing, O2 90 % on RA.

## 2015-06-09 NOTE — ED Notes (Signed)
RN and Ashok Cordia, MD, made aware of absent breath sounds.

## 2015-06-09 NOTE — ED Notes (Signed)
Patient transported to Ultrasound 

## 2015-06-09 NOTE — H&P (Signed)
History and Physical  [name]:[id number] DOB: 24-Jul-1933 DOA: 06/09/2015  Referring physician: EDP PCP: Alesia Richards, MD   Chief Complaint: sob  HPI: Kevin Ellison is a 81 y.o. male  With h/o htn, HLD, hypothyroid, bph presented to [facility] ED  Due to sob, patient reported he was doing well until a month ago, he started to have significant weight loss and progressive sob, he was seen by pulmonology due to concerning for metastatic lung cancer with bone mets, a biopsy of left supraclavicular node was done on on 12/27, pathology showed adenocarcinoma of the lung. Over  The past week, he became more sob, not able to sleep at night, he presented to the ED. He is found to be  tachypneic in the 30's and 40's, sinus tachcyardia, he was put on 5liter oxygen supplement, sats remain low 90's.  initial cxr showed right pleural effusion. Labs with leukocytosis, lactic acid wnl. He was found wheezing on exam, he is treated with nebs/abx, hospitalist called to admit the patient. Patient denies fever, does report running nose, denies sore  Throat, no diarrhea, no chest pain, no lower extremity edema. He quit smoking 1yr ago.   Review of Systems:  Detail per HPI, Review of systems are otherwise negative  Past Medical History  Diagnosis Date  . Hyperlipidemia   . Hypertension   . Thyroid disease   . Prediabetes   . GERD (gastroesophageal reflux disease)   . [place]   Past Surgical History  Procedure Laterality Date  . Cystoscopy  2002   Social History:  reports that he quit smoking about 28 years ago. His smoking use included Cigarettes. He has a 10 pack-year smoking history. He quit smokeless tobacco use about 28 years ago. He reports that he drinks about 1.2 oz of alcohol per week. He reports that he does not use illicit drugs. Patient lives at home & is able to participate in activities of daily living independently  Until a month ago, now significant sob even at  rest  Allergies  Allergen Reactions  . Cardura [Doxazosin Mesylate]     Unknown reaction   . Ace Inhibitors Cough    Family History  Problem Relation Age of Onset  . Heart disease Mother   . Heart attack Mother   . Pneumonia Father   . Heart disease Sister       Prior to Admission medications   Medication Sig Start Date End Date Taking? Authorizing Provider  bisoprolol-hydrochlorothiazide (ZIAC) 10-6.25 MG per tablet TAKE ONE TABLET BY MOUTH EVERY MORNING FOR BLOOD PRESSURE 01/11/15  Yes WUnk Pinto MD  diphenhydramine-acetaminophen (TYLENOL PM) 25-500 MG TABS tablet Take 2 tablets by mouth at bedtime as needed (for sleep).   Yes Historical Provider, MD  finasteride (PROSCAR) 5 MG tablet TAKE 1 TABLET BY MOUTH DAILY FOR PROSTATE 07/19/14  Yes WUnk Pinto MD  furosemide (LASIX) 40 MG tablet take 1 tablet by mouth daily for blood pressure and fluid 08/12/14  Yes WUnk Pinto MD  guaiFENesin (MUCINEX) 600 MG 12 hr tablet Take 600 mg by mouth daily as needed for cough or to loosen phlegm.   Yes Historical Provider, MD  levothyroxine (SYNTHROID, LEVOTHROID) 50 MCG tablet TAKE ONE TABLET BY MOUTH ONCE DAILY BEFORE BREAKFAST 11/26/14  Yes WUnk Pinto MD  losartan (COZAAR) 100 MG tablet Take 1 tablet (100 mg total) by mouth daily. for blood pressure 03/30/15  Yes WUnk Pinto MD  meloxicam (MOBIC) 15 MG  tablet TAKE ONE TABLET BY MOUTH ONE TIME DAILY AFTER A MEAL FOR ARTHRITIS 04/25/15  Yes Unk Pinto, MD  minoxidil (LONITEN) 10 MG tablet Take 1/4 to 1/2 to 1 tablet daily as directed for BP Patient taking differently: Take 5 mg by mouth daily.  01/31/14  Yes Unk Pinto, MD  pravastatin (PRAVACHOL) 40 MG tablet TAKE 1 TABLET BY MOUTH AT BEDTIME FOR CHOLESTEROL 01/29/15  Yes Unk Pinto, MD  tamsulosin (FLOMAX) 0.4 MG CAPS capsule TAKE ONE CAPSULE BY MOUTH ONE TIME DAILY 01/12/15  Yes Unk Pinto, MD  betamethasone dipropionate (DIPROLENE) 0.05 % ointment Apply  topically 2 (two) times daily. Patient not taking: Reported on 06/09/2015 07/12/14   Unk Pinto, MD    Physical Exam: BP 155/93 mmHg  Pulse 116  Temp(Src) 98.9 F (37.2 C) (Rectal)  Resp 37  SpO2 91%  General:  In moderate distress due to tachypnea Eyes: PERRL ENT: unremarkable Neck: supple, no JVD Cardiovascular: sinus tachycardia Respiratory: diminished on the right, mild wheezing on the left Abdomen: soft/ND/ND, positive bowel sounds Skin: no rash Musculoskeletal:  No edema Psychiatric: calm/cooperative Neurologic: no focal findings            Labs on Admission:  Basic Metabolic Panel:  Recent Labs Lab 06/07/15 1130 06/09/15 1110  NA 139 138  K 4.0 3.9  CL 101 101  CO2 28 27  GLUCOSE 121* 117*  BUN 16 16  CREATININE 0.98 1.01  CALCIUM 8.5* 8.3*   Liver Function Tests:  Recent Labs Lab 06/09/15 1110  AST 13*  ALT 12*  ALKPHOS 61  BILITOT 0.8  PROT 6.3*  ALBUMIN 2.8*   No results for input(s): LIPASE, AMYLASE in the last 168 hours. No results for input(s): AMMONIA in the last 168 hours. CBC:  Recent Labs Lab 06/07/15 1130 06/09/15 1110  WBC 16.1* 16.3*  NEUTROABS 13.6*  --   HGB 13.6 13.7  HCT 42.0 40.8  MCV 85.7 84.0  PLT 319 314   Cardiac Enzymes:  Recent Labs Lab 06/09/15 1110  TROPONINI 0.04*    BNP (last 3 results) No results for input(s): BNP in the last 8760 hours.  ProBNP (last 3 results) No results for input(s): PROBNP in the last 8760 hours.  CBG: No results for input(s): GLUCAP in the last 168 hours.  Radiological Exams on Admission: Dg Chest 2 View  06/09/2015  CLINICAL DATA:  Metastatic lung carcinoma. Shortness of breath, progressive over past week EXAM: CHEST  2 VIEW COMPARISON:  Chest radiograph May 11, 2015; chest CT May 17, 2015 FINDINGS: There is a moderate pleural effusion on the right, slightly larger compared to most recent study. There is compressive atelectasis in the right base. There is  widespread pulmonary metastatic disease with nodular lesions throughout both lungs, not appreciably changed. The heart is upper normal in size with pulmonary vascularity within normal limits. Adenopathy is present, better seen on CT examination. There is degenerative change in the thoracic spine with diffuse idiopathic skeletal hyperostosis. There are no blastic or lytic bone lesions appreciable radiographically. IMPRESSION: Widespread parenchymal metastatic disease. Moderate right pleural effusion, larger compared to most recent prior study. Compressive atelectasis on the right noted. No change in cardiac silhouette. Adenopathy is better seen on CT than on radiographic examination. There is diffuse idiopathic skeletal hyperostosis in the thoracic spine. Electronically Signed   By: Lowella Grip III M.D.   On: 06/09/2015 12:06    EKG: Independently reviewed. Sinus tachycardia  Assessment/Plan Present on Admission:  .  Respiratory distress  Hypoxic respiratory failure/ respiratory distress: with newly diagnosed widely spread adenocarcinoma of the lung, right pleural effusion, STAT US guided thoracentesis, continue abx, nebs. pulm consulted, admit to stepdown. Oncology also consulted.  Leukocytosis: no fever, from stress vs infection, continue abx for now.  HTN/HLD/hypothyroidsm: continue home meds including lasix.  Prediabetes, diet controlled,    DVT prophylaxis: lovenox  Consultants:  Pulmonary/critical care Oncology ( talked to Dr. Lindi Adie)  Code Status: DNR  Family Communication:  Patient and daughter  Disposition Plan: admit to stepdown  Time spent: 24mns  Washington Whedbee MD, PhD [facility] Pager [phone] 7PM-7AM, please contact night-coverage at [email or web address], password [email or web address]

## 2015-06-09 NOTE — ED Notes (Signed)
Bed: WA04 Expected date:  Expected time:  Means of arrival:  Comments: Tr 2 - absent lung sounds lower lobe

## 2015-06-09 NOTE — Progress Notes (Signed)
Utilization Review completed.  Natarsha Hurwitz RN CM  

## 2015-06-09 NOTE — ED Provider Notes (Addendum)
CSN: [id number]     Arrival date & time 06/09/15  1011 History   First MD Initiated Contact with Patient 06/09/15 1041     Chief Complaint  Patient presents with  . Shortness of Breath     (Consider location/radiation/quality/duration/timing/severity/associated sxs/prior Treatment) Patient is a 81 y.o. male presenting with shortness of breath. The history is provided by the patient.  Shortness of Breath Associated symptoms: no abdominal pain, no chest pain, no fever, no headaches, no neck pain, no rash, no sore throat and no vomiting   Patient w recent xr/ct chest showing multiple lung/thoracic metastatic lesions, and recent u/s guided supraclavicular l/n biopsy 2 days ago, c/o progressive sob in the past few days. Sob constant, persistent, mod-severe. Worse w activity/exertion. No chest pain. Non prod cough, occasional. No fever or chills. No leg swelling or pain. Quit smoking 30 yrs ago. +20-30 lb weight loss in past month.       Past Medical History  Diagnosis Date  . Hyperlipidemia   . Hypertension   . Thyroid disease   . Prediabetes   . GERD (gastroesophageal reflux disease)   . Cancer ([place])     lung   Past Surgical History  Procedure Laterality Date  . Cystoscopy  2002   Family History  Problem Relation Age of Onset  . Heart disease Mother   . Heart attack Mother   . Pneumonia Father   . Heart disease Sister    Social History  Substance Use Topics  . Smoking status: Former Smoker -- 1.00 packs/day for 10 years    Types: Cigarettes    Quit date: 06/12/1987  . Smokeless tobacco: Former Systems developer    Quit date: 06/12/1987  . Alcohol Use: 1.2 oz/week    2 Standard drinks or equivalent per week     Comment: occasional beer  14 beers a week    Review of Systems  Constitutional: Negative for fever and chills.  HENT: Negative for sore throat.   Eyes: Negative for redness.  Respiratory: Positive for shortness of breath.   Cardiovascular: Negative for chest pain and  leg swelling.  Gastrointestinal: Negative for vomiting, abdominal pain and diarrhea.  Genitourinary: Negative for flank pain.  Musculoskeletal: Negative for back pain and neck pain.  Skin: Negative for rash.  Neurological: Negative for headaches.  Hematological: Does not bruise/bleed easily.  Psychiatric/Behavioral: Negative for confusion.      Allergies  Ace inhibitors and Cardura  Home Medications   Prior to Admission medications   Medication Sig Start Date End Date Taking? Authorizing Provider  betamethasone dipropionate (DIPROLENE) 0.05 % ointment Apply topically 2 (two) times daily. 07/12/14   Unk Pinto, MD  bisoprolol-hydrochlorothiazide [facility]) 10-6.25 MG per tablet TAKE ONE TABLET BY MOUTH EVERY MORNING FOR BLOOD PRESSURE 01/11/15   Unk Pinto, MD  finasteride (PROSCAR) 5 MG tablet TAKE 1 TABLET BY MOUTH DAILY FOR PROSTATE 07/19/14   Unk Pinto, MD  furosemide (LASIX) 40 MG tablet take 1 tablet by mouth daily for blood pressure and fluid 08/12/14   Unk Pinto, MD  levothyroxine (SYNTHROID, LEVOTHROID) 50 MCG tablet TAKE ONE TABLET BY MOUTH ONCE DAILY BEFORE BREAKFAST 11/26/14   Unk Pinto, MD  losartan (COZAAR) 100 MG tablet Take 1 tablet (100 mg total) by mouth daily. for blood pressure 03/30/15   Unk Pinto, MD  meloxicam (MOBIC) 15 MG tablet TAKE ONE TABLET BY MOUTH ONE TIME DAILY AFTER A MEAL FOR ARTHRITIS 04/25/15   Unk Pinto, MD  minoxidil (LONITEN) 10 MG  tablet Take 1/4 to 1/2 to 1 tablet daily as directed for BP 01/31/14   Unk Pinto, MD  pravastatin (PRAVACHOL) 40 MG tablet TAKE 1 TABLET BY MOUTH AT BEDTIME FOR CHOLESTEROL 01/29/15   Unk Pinto, MD  tamsulosin (FLOMAX) 0.4 MG CAPS capsule TAKE ONE CAPSULE BY MOUTH ONE TIME DAILY 01/12/15   Unk Pinto, MD   BP 153/95 mmHg  Pulse 89  Resp 26  SpO2 95% Physical Exam  Constitutional: He is oriented to person, place, and time. He appears well-developed and well-nourished.   Tachypneic.   HENT:  Mouth/Throat: Oropharynx is clear and moist.  Eyes: Conjunctivae are normal. Pupils are equal, round, and reactive to light. No scleral icterus.  Neck: Neck supple. No tracheal deviation present.  Cardiovascular: Normal rate, regular rhythm, normal heart sounds and intact distal pulses.   Pulmonary/Chest: Effort normal. No accessory muscle usage. No respiratory distress.  Decreased bs right.   Abdominal: Soft. Bowel sounds are normal. He exhibits no distension. There is no tenderness.  Musculoskeletal: Normal range of motion. He exhibits no edema or tenderness.  Neurological: He is alert and oriented to person, place, and time.  Skin: Skin is warm and dry.  Psychiatric: He has a normal mood and affect.  Nursing note and vitals reviewed.   ED Course  Procedures (including critical care time) Labs Review    Results for orders placed or performed during the hospital encounter of 06/09/15  CBC  Result Value Ref Range   WBC 16.3 (H) 4.0 - 10.5 K/uL   RBC 4.86 4.22 - 5.81 MIL/uL   Hemoglobin 13.7 13.0 - 17.0 g/dL   HCT 40.8 39.0 - 52.0 %   MCV 84.0 78.0 - 100.0 fL   MCH 28.2 26.0 - 34.0 pg   MCHC 33.6 30.0 - 36.0 g/dL   RDW 14.7 11.5 - 15.5 %   Platelets 314 150 - 400 K/uL  Comprehensive metabolic panel  Result Value Ref Range   Sodium 138 135 - 145 mmol/L   Potassium 3.9 3.5 - 5.1 mmol/L   Chloride 101 101 - 111 mmol/L   CO2 27 22 - 32 mmol/L   Glucose, Bld 117 (H) 65 - 99 mg/dL   BUN 16 6 - 20 mg/dL   Creatinine, Ser 1.01 0.61 - 1.24 mg/dL   Calcium 8.3 (L) 8.9 - 10.3 mg/dL   Total Protein 6.3 (L) 6.5 - 8.1 g/dL   Albumin 2.8 (L) 3.5 - 5.0 g/dL   AST 13 (L) 15 - 41 U/L   ALT 12 (L) 17 - 63 U/L   Alkaline Phosphatase 61 38 - 126 U/L   Total Bilirubin 0.8 0.3 - 1.2 mg/dL   GFR calc non Af Amer >60 >60 mL/min   GFR calc Af Amer >60 >60 mL/min   Anion gap 10 5 - 15  Troponin I  Result Value Ref Range   Troponin I 0.04 (H) <0.031 ng/mL  I-Stat CG4  Lactic Acid, ED  Result Value Ref Range   Lactic Acid, Venous 1.59 0.5 - 2.0 mmol/L   Dg Chest 2 View  06/09/2015  CLINICAL DATA:  Metastatic lung carcinoma. Shortness of breath, progressive over past week EXAM: CHEST  2 VIEW COMPARISON:  Chest radiograph May 11, 2015; chest CT May 17, 2015 FINDINGS: There is a moderate pleural effusion on the right, slightly larger compared to most recent study. There is compressive atelectasis in the right base. There is widespread pulmonary metastatic disease with nodular lesions throughout both lungs,  not appreciably changed. The heart is upper normal in size with pulmonary vascularity within normal limits. Adenopathy is present, better seen on CT examination. There is degenerative change in the thoracic spine with diffuse idiopathic skeletal hyperostosis. There are no blastic or lytic bone lesions appreciable radiographically. IMPRESSION: Widespread parenchymal metastatic disease. Moderate right pleural effusion, larger compared to most recent prior study. Compressive atelectasis on the right noted. No change in cardiac silhouette. Adenopathy is better seen on CT than on radiographic examination. There is diffuse idiopathic skeletal hyperostosis in the thoracic spine. Electronically Signed   By: Lowella Grip III M.D.   On: 06/09/2015 12:06   Dg Chest 2 View  05/11/2015  CLINICAL DATA:  Cough. Weight loss. Decreased breath sounds in the right lower lobe. Wheezing. EXAM: CHEST  2 VIEW COMPARISON:  None. FINDINGS: Normal heart size. Atherosclerotic aortic arch. Mildly thickened bilateral paratracheal stripes. Mildly prominent hila. No pneumothorax. Slight blunting of the right costophrenic angle, suggesting a trace right pleural effusion. No left pleural effusion. There are innumerable nodular opacities scattered throughout both lungs, most prominent in the lower lungs. No pulmonary edema. IMPRESSION: 1. Innumerable nodular opacities scattered throughout  both lungs, most prominent in the lower lungs, highly suspicious for pulmonary metastases. 2. Mild thickening of the bilateral paratracheal stripe and mildly prominent hilar, which could indicate mediastinal/hilar adenopathy. 3. Possible trace right pleural effusion. 4. Recommend further evaluation with chest CT with IV contrast. These results will be called to the ordering clinician or representative by the Radiologist Assistant, and communication documented in the PACS or zVision Dashboard. Electronically Signed   By: Ilona Sorrel M.D.   On: 05/11/2015 16:15   Ct Chest W Contrast  05/17/2015  CLINICAL DATA:  Unexplained weight loss EXAM: CT CHEST, ABDOMEN, AND PELVIS WITH CONTRAST TECHNIQUE: Multidetector CT imaging of the chest, abdomen and pelvis was performed following the standard protocol during bolus administration of intravenous contrast. CONTRAST:  43m OMNIPAQUE IOHEXOL 300 MG/ML SOLN, 720mOMNIPAQUE IOHEXOL 300 MG/ML SOLN COMPARISON:  None. FINDINGS: CT CHEST FINDINGS Mediastinum/Nodes: Heart size appears normal. No pericardial effusion. The trachea appears patent and is midline. Normal appearance of the esophagus. Aortic atherosclerosis noted. LAD, RCA and left circumflex coronary artery calcifications noted. Extensive thoracic adenopathy is identified. Left axillary lymph node measures 1.8 cm, image 12 of series 2. Left supraclavicular lymph node measures 1.2 cm, image 6 of series 2. Index right paratracheal lymph node measures 2.3 cm, image 21 of series 2. Index sub- carinal node measures 3.9 cm, image 30 of series 2. Lungs/Pleura: Small right pleural effusion. Numerous pulmonary nodules and masses are present throughout both lungs compatible with widespread pulmonary metastasis. Index lesion within the right upper lobe measures 4.1 cm, image 21 of series 4. Index lesion in the right lower lobe measures 2.8 cm, image 41 of series 4. The index lesion within the left upper lobe measures 2 cm, image  34 of series 4. Musculoskeletal: Degenerative disc disease noted within the thoracic spine. No aggressive lytic or sclerotic bone lesions identified. CT ABDOMEN PELVIS FINDINGS Hepatobiliary: There is a low attenuation structure along the dome of liver which measures 8 mm, image 51 of series 2. This is too small to characterize. Gallbladder is normal. No biliary dilatation. Pancreas: Normal appearance of the pancreas. Spleen: The spleen is negative. Adrenals/Urinary Tract: The right adrenal gland is normal. There is a nodule in the left adrenal gland which measures 1.6 cm, image 61 of series 2. Bilateral renal cysts noted. The  urinary bladder appears normal. Stomach/Bowel: The stomach and the small bowel loops have a normal course and caliber. The appendix is visualized and appears normal. Unremarkable appearance of the colon. Vascular/Lymphatic: Calcified atherosclerotic disease involves the abdominal aorta. No aneurysm. No enlarged retroperitoneal or mesenteric adenopathy. No enlarged pelvic or inguinal lymph nodes. Reproductive: Mild prostate gland enlargement. Other: No free fluid or fluid collections identified within the upper abdomen. Musculoskeletal: No aggressive lytic or sclerotic bone lesions identified. All degenerative disc disease is identified within the lumbar spine. This is most advanced at the L5-S1 level. IMPRESSION: 1. Widespread pulmonary metastasis with numerous nodules and masses identified both lungs. 2. Extensive thoracic metastasis with bilateral mediastinal adenopathy, left supraclavicular adenopathy and left axillary adenopathy. 3. Right pleural effusion 4. Indeterminate nodule and left adrenal gland measures 1.6 cm. Cannot rule out metastatic disease. 5. Thoracic and lumbar degenerative disc disease. 6. Aortic atherosclerosis and multi vessel coronary artery calcification Electronically Signed   By: Kerby Moors M.D.   On: 05/17/2015 11:58   Ct Abdomen Pelvis W Contrast  05/17/2015   CLINICAL DATA:  Unexplained weight loss EXAM: CT CHEST, ABDOMEN, AND PELVIS WITH CONTRAST TECHNIQUE: Multidetector CT imaging of the chest, abdomen and pelvis was performed following the standard protocol during bolus administration of intravenous contrast. CONTRAST:  26m OMNIPAQUE IOHEXOL 300 MG/ML SOLN, 739mOMNIPAQUE IOHEXOL 300 MG/ML SOLN COMPARISON:  None. FINDINGS: CT CHEST FINDINGS Mediastinum/Nodes: Heart size appears normal. No pericardial effusion. The trachea appears patent and is midline. Normal appearance of the esophagus. Aortic atherosclerosis noted. LAD, RCA and left circumflex coronary artery calcifications noted. Extensive thoracic adenopathy is identified. Left axillary lymph node measures 1.8 cm, image 12 of series 2. Left supraclavicular lymph node measures 1.2 cm, image 6 of series 2. Index right paratracheal lymph node measures 2.3 cm, image 21 of series 2. Index sub- carinal node measures 3.9 cm, image 30 of series 2. Lungs/Pleura: Small right pleural effusion. Numerous pulmonary nodules and masses are present throughout both lungs compatible with widespread pulmonary metastasis. Index lesion within the right upper lobe measures 4.1 cm, image 21 of series 4. Index lesion in the right lower lobe measures 2.8 cm, image 41 of series 4. The index lesion within the left upper lobe measures 2 cm, image 34 of series 4. Musculoskeletal: Degenerative disc disease noted within the thoracic spine. No aggressive lytic or sclerotic bone lesions identified. CT ABDOMEN PELVIS FINDINGS Hepatobiliary: There is a low attenuation structure along the dome of liver which measures 8 mm, image 51 of series 2. This is too small to characterize. Gallbladder is normal. No biliary dilatation. Pancreas: Normal appearance of the pancreas. Spleen: The spleen is negative. Adrenals/Urinary Tract: The right adrenal gland is normal. There is a nodule in the left adrenal gland which measures 1.6 cm, image 61 of series 2.  Bilateral renal cysts noted. The urinary bladder appears normal. Stomach/Bowel: The stomach and the small bowel loops have a normal course and caliber. The appendix is visualized and appears normal. Unremarkable appearance of the colon. Vascular/Lymphatic: Calcified atherosclerotic disease involves the abdominal aorta. No aneurysm. No enlarged retroperitoneal or mesenteric adenopathy. No enlarged pelvic or inguinal lymph nodes. Reproductive: Mild prostate gland enlargement. Other: No free fluid or fluid collections identified within the upper abdomen. Musculoskeletal: No aggressive lytic or sclerotic bone lesions identified. All degenerative disc disease is identified within the lumbar spine. This is most advanced at the L5-S1 level. IMPRESSION: 1. Widespread pulmonary metastasis with numerous nodules and masses identified both  lungs. 2. Extensive thoracic metastasis with bilateral mediastinal adenopathy, left supraclavicular adenopathy and left axillary adenopathy. 3. Right pleural effusion 4. Indeterminate nodule and left adrenal gland measures 1.6 cm. Cannot rule out metastatic disease. 5. Thoracic and lumbar degenerative disc disease. 6. Aortic atherosclerosis and multi vessel coronary artery calcification Electronically Signed   By: Kerby Moors M.D.   On: 05/17/2015 11:58   Nm Pet Image Initial (pi) Skull Base To Thigh  05/26/2015  CLINICAL DATA:  Initial treatment strategy for Lung cancer. EXAM: NUCLEAR MEDICINE PET SKULL BASE TO THIGH TECHNIQUE: 10.4 mCi F-18 FDG was injected intravenously. Full-ring PET imaging was performed from the skull base to thigh after the radiotracer. CT data was obtained and used for attenuation correction and anatomic localization. FASTING BLOOD GLUCOSE:  Value: 109 mg/dl COMPARISON:  None FINDINGS: NECK Hypermetabolic left level 4 lymph node measures 1.2 cm and has an SUV max equal to 7.7, image 41 of series 4. CHEST At the level of the thoracic inlet there are bilateral  hypermetabolic lymph nodes. Index node measures 1 cm and has an SUV max equal to 16.7. On the left lymph node measures 1.3 cm and has an SUV max equal to 13.75, image 49 of series 4. Left axillary node measures 1.5 cm and has an SUV max equal to 16.89, image 60 of series 4. Extensive bilateral hypermetabolic and enlarged mediastinal and hilar adenopathy. Index right paratracheal lymph node measures 2.4 cm and has an SUV max equal to 9.6, image 66 of series 4. Pre-vascular lymph node along the left side of the transverse aortic arch measures 1.6 cm and has an SUV max equal to 23 0.7, image 70 of series 4. Large sub- carinal lymph node measures 3.6 cm and has an SUV max equal to 18.76, image 80 of series 4. Bilateral pleural effusions are identified right greater than left. There is evidence of trans pleural spread of tumor involving both hemi thoraces. Hypermetabolic tumor along the posterior right costophrenic sulcus has an SUV max equal to 10.3, image 125 of the PET images. Hypermetabolic pleural tumor along the posterior costophrenic sulcus has an SUV max equal to 10.7, image 107. Innumerable hypermetabolic pulmonary nodules are scattered throughout both lungs. These are too numerous to count. Index lesion within the right upper lobe measures 4 cm and has an SUV max equal to 18.5, image 69 of series 4. Index nodule within the left upper lobe measures 1.9 cm and has an SUV max equal to 13.16. ABDOMEN/PELVIS Nodule in the left adrenal gland measures 1.7 cm and 11.6 Hounsfield units. SUV max is equal to 3.2, image 116 of series 4. No abnormal scratch set no mass or nodule within the right adrenal gland. No abnormal uptake identified within the liver, pancreas, or spleen. No hypermetabolic lymph nodes within the upper abdomen. No hypermetabolic pelvic adenopathy. SKELETON Hypermetabolic lesion within the left iliac bone is identified. This measures 2.8 cm and has an SUV max equal to 16.1. IMPRESSION: 1. Extensive  pulmonary metastatic disease is identified throughout both lungs. Primary site of disease within the chest is un known. Assuming non-small cell histology findings would be consistent with a [id number] lesion or stage IV disease. 2. Left cervical, bilateral mediastinal and bilateral hilar, and left axillary lymph node metastasis. 3. Evidence of trans pleural spread of tumor involving both hemithoraces. 4. 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 5. Indeterminate left adrenal nodule exhibits low level FDG uptake. Electronically Signed   By: Queen Slough.D.  On: 05/26/2015 14:54   US Biopsy  06/07/2015  CLINICAL DATA:  Multiple bilateral pulmonary nodules and lymphadenopathy in the neck and chest. The most accessible initial source for tissue diagnosis are enlarged left supraclavicular/ lower cervical lymph nodes. EXAM: ULTRASOUND GUIDED CORE BIOPSY OF LEFT SUPRACLAVICULAR LYMPH NODE MEDICATIONS: 2.0 mg IV Versed; 50 mcg IV Fentanyl Total Moderate Sedation Time: 10 minutes. PROCEDURE: The procedure, risks, benefits, and alternatives were explained to the patient. Questions regarding the procedure were encouraged and answered. The patient understands and consents to the procedure. The left neck was prepped with chlorhexidine in a sterile fashion, and a sterile drape was applied covering the operative field. A sterile gown and sterile gloves were used for the procedure. Local anesthesia was provided with 1% Lidocaine. Ultrasound was used to localize left supraclavicular lymphadenopathy. An 18 gauge core biopsy device was utilized in obtaining 4 core biopsy samples through an enlarged lymph node. Core biopsy samples were submitted in saline. COMPLICATIONS: None. FINDINGS: The largest left supraclavicular lymph node measures 2 cm in greatest diameter. Solid tissue was obtained from the lymph node. IMPRESSION: Ultrasound-guided core biopsy performed of enlarged left supraclavicular lymph node.  Electronically Signed   By: Aletta Edouard M.D.   On: 06/07/2015 16:34       I have personally reviewed and evaluated these images and lab results as part of my medical decision-making.   EKG Interpretation   Date/Time:  Thursday June 09 2015 10:21:04 EST Ventricular Rate:  98 PR Interval:  151 QRS Duration: 84 QT Interval:  343 QTC Calculation: 438 R Axis:   62 Text Interpretation:  Sinus rhythm Baseline wander No significant change  since last tracing Confirmed by Marguerite Jarboe  MD, Nicasio Barlowe ([id number]) on 06/09/2015  10:42:41 AM      MDM   Iv ns.   o2 [place]. Cxr. Labs.  Reviewed nursing notes and prior charts for additional history.   Wbc elev, infiltrate/effusion on cxr, ?post obstructive pna. Will cover w abx.  Will admit patient to med service, may benefit from pulm and onc consults, may benefit from IR thoracentesis for symptom improvement.   Path from recent bx c/w adenoca/lung - pt informed of results.  Mild wheezing on exam - will try alb/atrovent neb for symptom improvement.  Discussed pt with Hospitalists - she will get IR thoracentesis now, requests I talk to pulm - pulmonary consulted - will have see pt.     Lajean Saver, MD 06/09/15 [phone]

## 2015-06-09 NOTE — Consult Note (Signed)
[facility] CONSULT NOTE  Patient Care Team: Unk Pinto, MD as PCP - General (Internal Medicine) Wynona Neat. Shana Chute, MD as Referring Physician (Gastroenterology) Tanda Rockers, MD as Consulting Physician (Pulmonary Disease)  CHIEF COMPLAINTS/PURPOSE OF CONSULTATION:  Newly diagnosed metastatic lung cancer  HISTORY OF PRESENTING ILLNESS:  Kevin Ellison 81 y.o. male is here because of recent diagnosis of metastatic lung cancer. He presented with difficulty with breathing accompanied by cough with yellowish sputum. He underwent a chest x-ray and later a CT scan on 05/17/2015. The CT scan showed widespread pulmonary lesions accompanied by hilar mediastinal supraclavicular and axillary lymphadenopathy in addition to right pleural effusion the left adrenal gland nodule. He saw Dr. Melvyn Novas who obtained a PET CT scan that confirmed these were metastatic lesions. He subsequently underwent left supraclavicular lymph node biopsy that came back as adenocarcinoma that was CK 7 and TTF-1 positive. He came into the emergency room complaining of worsening shortness of breath for the last 1-2 days. Chest x-ray revealed worsening pleural effusion. He underwent ultrasound-guided thoracentesis and 1.5 L of amber color fluid was taken out. He is currently in the process of getting admitted to the hospital for continued supportive care. We are consulted to assist with treatment plan regarding his metastatic lung cancer.  I reviewed her records extensively and collaborated the history with the patient.  MEDICAL HISTORY:  Past Medical History  Diagnosis Date  . Hyperlipidemia   . Hypertension   . Thyroid disease   . Prediabetes   . GERD (gastroesophageal reflux disease)   . Cancer ([place])     lung    SURGICAL HISTORY: Past Surgical History  Procedure Laterality Date  . Cystoscopy  2002    SOCIAL HISTORY: Social History   Social History  . Marital Status: Married    Spouse Name: N/A  .  Number of Children: N/A  . Years of Education: N/A   Occupational History  . Not on file.   Social History Main Topics  . Smoking status: Former Smoker -- 1.00 packs/day for 10 years    Types: Cigarettes    Quit date: 06/12/1987  . Smokeless tobacco: Former Systems developer    Quit date: 06/12/1987  . Alcohol Use: 1.2 oz/week    2 Standard drinks or equivalent per week     Comment: occasional beer  14 beers a week  . Drug Use: No  . Sexual Activity: Not on file   Other Topics Concern  . Not on file   Social History Narrative    FAMILY HISTORY: Family History  Problem Relation Age of Onset  . Heart disease Mother   . Heart attack Mother   . Pneumonia Father   . Heart disease Sister     ALLERGIES:  is allergic to cardura and ace inhibitors.  MEDICATIONS:  Current Facility-Administered Medications  Medication Dose Route Frequency Provider Last Rate Last Dose  . 0.9 %  sodium chloride infusion   Intravenous Continuous Lajean Saver, MD   Stopped at 06/09/15 1536  . [START ON 06/10/2015] azithromycin (ZITHROMAX) 500 mg in dextrose 5 % 250 mL IVPB  500 mg Intravenous Q24H Florencia Reasons, MD      . Derrill Memo ON 06/10/2015] cefTRIAXone (ROCEPHIN) 1 g in dextrose 5 % 50 mL IVPB  1 g Intravenous Q24H Florencia Reasons, MD      . ipratropium (ATROVENT) nebulizer solution 0.5 mg  0.5 mg Nebulization Q6H Florencia Reasons, MD   0.5 mg at 06/09/15 1531  .  levalbuterol (XOPENEX) nebulizer solution 1.25 mg  1.25 mg Nebulization Q6H Florencia Reasons, MD       Current Outpatient Prescriptions  Medication Sig Dispense Refill  . bisoprolol-hydrochlorothiazide (ZIAC) 10-6.25 MG per tablet TAKE ONE TABLET BY MOUTH EVERY MORNING FOR BLOOD PRESSURE 90 tablet 2  . diphenhydramine-acetaminophen (TYLENOL PM) 25-500 MG TABS tablet Take 2 tablets by mouth at bedtime as needed (for sleep).    . finasteride (PROSCAR) 5 MG tablet TAKE 1 TABLET BY MOUTH DAILY FOR PROSTATE 90 tablet 99  . furosemide (LASIX) 40 MG tablet take 1 tablet by mouth daily for  blood pressure and fluid 90 tablet PRN  . guaiFENesin (MUCINEX) 600 MG 12 hr tablet Take 600 mg by mouth daily as needed for cough or to loosen phlegm.    Marland Kitchen levothyroxine (SYNTHROID, LEVOTHROID) 50 MCG tablet TAKE ONE TABLET BY MOUTH ONCE DAILY BEFORE BREAKFAST 90 tablet 3  . losartan (COZAAR) 100 MG tablet Take 1 tablet (100 mg total) by mouth daily. for blood pressure 90 tablet 1  . meloxicam (MOBIC) 15 MG tablet TAKE ONE TABLET BY MOUTH ONE TIME DAILY AFTER A MEAL FOR ARTHRITIS 30 tablet 6  . minoxidil (LONITEN) 10 MG tablet Take 1/4 to 1/2 to 1 tablet daily as directed for BP (Patient taking differently: Take 5 mg by mouth daily. ) 90 tablet 99  . pravastatin (PRAVACHOL) 40 MG tablet TAKE 1 TABLET BY MOUTH AT BEDTIME FOR CHOLESTEROL 90 tablet 3  . tamsulosin (FLOMAX) 0.4 MG CAPS capsule TAKE ONE CAPSULE BY MOUTH ONE TIME DAILY 90 capsule 3  . betamethasone dipropionate (DIPROLENE) 0.05 % ointment Apply topically 2 (two) times daily. (Patient not taking: Reported on 06/09/2015) 50 g 99    REVIEW OF SYSTEMS:   Constitutional: Generalized weakness Eyes: Denies blurriness of vision, double vision or watery eyes Ears, nose, mouth, throat, and face: Denies mucositis or sore throat Respiratory: Profound shortness of breath and cough Cardiovascular: Denies palpitation, chest discomfort or lower extremity swelling Gastrointestinal:  Denies nausea, heartburn or change in bowel habits Lymphatics: Enlarged supraclavicular lymph node Neurological:Denies numbness, tingling or new weaknesses Behavioral/Psych: Mood is stable  All other systems were reviewed with the patient and are negative.  PHYSICAL EXAMINATION: ECOG PERFORMANCE STATUS: 3 - Symptomatic, >50% confined to bed  Filed Vitals:   06/09/15 1645 06/09/15 1700  BP: 134/82   Pulse: 112 113  Temp:    Resp: 38 34   There were no vitals filed for this visit.  GENERAL:alert, no distress and comfortable SKIN: Intermittent bruises EYES:  normal, conjunctiva are pink and non-injected, sclera clear LUNGS: Diminished breath sounds at both lung bases with mild expiratory wheeze HEART: regular rate & rhythm and no murmurs and no lower extremity edema ABDOMEN:abdomen soft, non-tender and normal bowel sounds  LABORATORY DATA:  I have reviewed the data as listed Lab Results  Component Value Date   WBC 16.3* 06/09/2015   HGB 13.7 06/09/2015   HCT 40.8 06/09/2015   MCV 84.0 06/09/2015   PLT 314 06/09/2015   Lab Results  Component Value Date   NA 138 06/09/2015   K 3.9 06/09/2015   CL 101 06/09/2015   CO2 27 06/09/2015    RADIOGRAPHIC STUDIES: I have personally reviewed the radiological reports and agreed with the findings in the report.  ASSESSMENT AND PLAN:  1. Metastatic adenocarcinoma of the lung with bilateral lung metastases, supraclavicular, axillary, mediastinal, hilar, left adrenal, left iliac bone metastases. I discussed with the patient and his  family the classification of lung cancers and wear adenocarcinoma falls under the non-small cell variety.  2. I discussed the standard approach of treatment of lung cancer would be depending on PDL 1, EGFR, testing. If he does not have those abnormalities then patient's would be treated with chemotherapy.  3. Prognosis: Given his age and his performance status, I anticipate that he would not have marked improvement in his overall survival in spite of our best to first with targeted therapies or chemotherapy.  4. Plan: Patient wishes not to be treated with any palliative biologic or chemotherapies. His wishes to be kept comfortable.Code status: [place]  5. Please consult hospice care to assist with end-of-life care issues. Patient and his family completely agree with this plan.  All questions were answered. The patient knows to call the clinic with any problems, questions or concerns.    Rulon Eisenmenger, MD _0 @

## 2015-06-09 NOTE — ED Notes (Signed)
Patient transported to X-ray 

## 2015-06-10 LAB — CBC
HCT: 38.4 % — ABNORMAL LOW (ref 39.0–52.0)
Hemoglobin: 12.2 g/dL — ABNORMAL LOW (ref 13.0–17.0)
MCH: 27.7 pg (ref 26.0–34.0)
MCHC: 31.8 g/dL (ref 30.0–36.0)
MCV: 87.3 fL (ref 78.0–100.0)
Platelets: 269 10*3/uL (ref 150–400)
RBC: 4.4 MIL/uL (ref 4.22–5.81)
RDW: 15 % (ref 11.5–15.5)
WBC: 13.3 10*3/uL — AB (ref 4.0–10.5)

## 2015-06-10 LAB — COMPREHENSIVE METABOLIC PANEL
ALK PHOS: 48 U/L (ref 38–126)
ALT: 10 U/L — AB (ref 17–63)
AST: 13 U/L — AB (ref 15–41)
Albumin: 2.5 g/dL — ABNORMAL LOW (ref 3.5–5.0)
Anion gap: 10 (ref 5–15)
BILIRUBIN TOTAL: 0.9 mg/dL (ref 0.3–1.2)
BUN: 16 mg/dL (ref 6–20)
CALCIUM: 8.2 mg/dL — AB (ref 8.9–10.3)
CO2: 29 mmol/L (ref 22–32)
CREATININE: 1.1 mg/dL (ref 0.61–1.24)
Chloride: 103 mmol/L (ref 101–111)
Glucose, Bld: 107 mg/dL — ABNORMAL HIGH (ref 65–99)
Potassium: 3.9 mmol/L (ref 3.5–5.1)
Sodium: 142 mmol/L (ref 135–145)
TOTAL PROTEIN: 5.4 g/dL — AB (ref 6.5–8.1)

## 2015-06-10 LAB — FUNGAL STAIN: Fungal Smear: NONE SEEN

## 2015-06-10 LAB — PROTIME-INR
INR: 1.3 (ref 0.00–1.49)
Prothrombin Time: 16.4 seconds — ABNORMAL HIGH (ref 11.6–15.2)

## 2015-06-10 NOTE — Clinical Social Work Note (Signed)
Clinical Social Work Assessment  Patient Details  Name: Kevin Ellison MRN: [id number] Date of Birth: 1933-10-12  Date of referral:  06/10/15               Reason for consult:  Facility Placement, End of Life/Hospice                Permission sought to share information with:  Chartered certified accountant granted to share information::  Yes, Verbal Permission Granted  Name::        Agency::     Relationship::     Contact Information:     Housing/Transportation Living arrangements for the past 2 months:  Single Family Home Source of Information:  Adult Children, Spouse Patient Interpreter Needed:  None Criminal Activity/Legal Involvement Pertinent to Current Situation/Hospitalization:  No - Comment as needed Significant Relationships:  Adult Children, Spouse Lives with:  Spouse Do you feel safe going back to the place where you live?   (Residential Hospice Home needed.) Need for family participation in patient care:  Yes (Comment)  Care giving concerns: Spouse is unable to manage pt's care following hospital d/c.   Social Worker assessment / plan: Pt hospitalized on 06/09/15 with Respiratory Distress. CSW consulted to assist with d/c planning. PN reviewed. CSW spoke with MD. Pt sleeping when CSW attempted to visit. CSW met with pt's spouse and daughter. Palliative Care Team has been following to provide support and assist with [place]. Residential Hospice Home placement has been recommended. Family has chosen [facility], but unfortunately there are no openings at this time. Family has requested H[facility] at [facility] for placement. CSW has left a message for intake and is awaiting return call to provide a referral. CSW will continue to follow to assist with d/c planning needs.  Employment status:  Retired Nurse, adult PT Recommendations:  Not assessed at this time Information / Referral to community resources:  Other (Comment Required)  (Residential Hospice Home info)  Patient/Family's Response to care:  Family is unable to provide needed care at home and would like residential hospice home placement.  Patient/Family's Understanding of and Emotional Response to Diagnosis, Current Treatment, and Prognosis: Pt / family are aware of pt's medical status and prognosis. Pt was hoping to return home with hospice but family is unable to manage pt's care. Family is hoping for placement close to home to enable frequent visits.  Emotional Assessment Appearance:  Appears stated age Attitude/Demeanor/Rapport:  Unable to Assess Affect (typically observed):  Unable to Assess Orientation:  Oriented to Self, Oriented to Place, Oriented to  Time, Oriented to Situation Alcohol / Substance use:  Not Applicable Psych involvement (Current and /or in the community):  No (Comment)  Discharge Needs  Concerns to be addressed:  Discharge Planning Concerns Readmission within the last 30 days:  No Current discharge risk:  None Barriers to Discharge:  No Barriers Identified   Loraine Maple  [phone] 06/10/2015, 3:34 PM

## 2015-06-10 NOTE — Consult Note (Addendum)
Consultation Note Date: 06/10/2015   Patient Name: Kevin Ellison  DOB: 1933/09/27  MRN: [id number]  Age / Sex: 81 y.o., male  PCP: Unk Pinto, MD Referring Physician: Florencia Reasons, MD  Reason for Consultation: Disposition, Establishing goals of care and Non pain symptom management    Clinical Assessment/Narrative: 80 yo man with metastatic lung cancer widespread dx. Now s/p thoracenetsis. He understands poor prognosis and does not want to pursue and chemotherapy of disease prolonging modalities. PC consulted for goals and hospice options.  He complains of insomnia, anxiety and is in a state of processing the difficult news that he has terminal illness.  Contacts/Participants in Discussion: Primary Decision Maker: self   Relationship to Patient self HCPOA: no  Children live out of town  SUMMARY OF RECOMMENDATIONS  Code Status/Advance Care Planning: DNR    Code Status Orders        Start     Ordered   06/09/15 1742  Do not attempt resuscitation (DNR)   Continuous    Question Answer Comment  In the event of cardiac or respiratory ARREST Do not call a "code blue"   In the event of cardiac or respiratory ARREST Do not perform Intubation, CPR, defibrillation or ACLS   In the event of cardiac or respiratory ARREST Use medication by any route, position, wound care, and other measures to relive pain and suffering. May use oxygen, suction and manual treatment of airway obstruction as needed for comfort.   Comments Hospice care      06/09/15 1742    Advance Directive Documentation        Most Recent Value   Type of Advance Directive  Healthcare Power of Attorney, Living will   Pre-existing out of facility DNR order (yellow form or pink MOST form)     "MOST" Form in Place?        Other Directives:None  Symptom Management:   Pain: Will start scheduled hydrocodone QHS ABS AND PRN IV morphine fpor pain or  dyspnea  For sleep will start scheduled trazadone and IV Ativan  Palliative Prophylaxis:   Aspiration, Bowel Regimen, Delirium Protocol, Eye Care, Frequent Pain Assessment and Oral Care  Additional Recommendations (Limitations, Scope, Preferences):  Full Comfort Care   Psycho-social/Spiritual:  Support System: Strong Desire for further Chaplaincy support:yes Additional Recommendations: Caregiving  Support/Resources and Education on Hospice  Prognosis: < 4 weeks  Discharge Planning: Home with Hospice   Chief Complaint/ Primary Diagnoses: Present on Admission:  . Respiratory distress  I have reviewed the medical record, interviewed the patient and family, and examined the patient. The following aspects are pertinent.  Past Medical History  Diagnosis Date  . Hyperlipidemia   . Hypertension   . Thyroid disease   . Prediabetes   . GERD (gastroesophageal reflux disease)   . Cancer [facility])     lung   Social History   Social History  . Marital Status: Married    Spouse Name: N/A  . Number of Children: N/A  . Years of Education: N/A   Social History Main Topics  . Smoking status: Former Smoker -- 1.00 packs/day for 10 years    Types: Cigarettes    Quit date: 06/12/1987  . Smokeless tobacco: Former Systems developer    Quit date: 06/12/1987  . Alcohol Use: 1.2 oz/week    2 Standard drinks or equivalent per week     Comment: occasional beer  14 beers a week  . Drug Use: No  . Sexual Activity: Not Asked  Other Topics Concern  . None   Social History Narrative   Family History  Problem Relation Age of Onset  . Heart disease Mother   . Heart attack Mother   . Pneumonia Father   . Heart disease Sister    Scheduled Meds: . azithromycin  500 mg Intravenous Q24H  . bisoprolol-hydrochlorothiazide  1 tablet Oral Daily  . cefTRIAXone (ROCEPHIN)  IV  1 g Intravenous Q24H  . Chlorhexidine Gluconate Cloth  6 each Topical Q0600  . finasteride  5 mg Oral Daily  . furosemide  40  mg Oral Daily  . HYDROcodone-acetaminophen  1 tablet Oral QHS  . losartan  100 mg Oral Daily  . meloxicam  7.5 mg Oral Daily  . minoxidil  5 mg Oral Daily  . mupirocin ointment  1 application Nasal BID  . tamsulosin  0.4 mg Oral Daily  . traZODone  50 mg Oral QHS   Continuous Infusions: . sodium chloride Stopped (06/09/15 1536)   PRN Meds:.ipratropium, levalbuterol, LORazepam, morphine injection Medications Prior to Admission:  Prior to Admission medications   Medication Sig Start Date End Date Taking? Authorizing Provider  bisoprolol-hydrochlorothiazide (ZIAC) 10-6.25 MG per tablet TAKE ONE TABLET BY MOUTH EVERY MORNING FOR BLOOD PRESSURE 01/11/15  Yes Unk Pinto, MD  diphenhydramine-acetaminophen (TYLENOL PM) 25-500 MG TABS tablet Take 2 tablets by mouth at bedtime as needed (for sleep).   Yes Historical Provider, MD  finasteride (PROSCAR) 5 MG tablet TAKE 1 TABLET BY MOUTH DAILY FOR PROSTATE 07/19/14  Yes Unk Pinto, MD  furosemide (LASIX) 40 MG tablet take 1 tablet by mouth daily for blood pressure and fluid 08/12/14  Yes Unk Pinto, MD  guaiFENesin (MUCINEX) 600 MG 12 hr tablet Take 600 mg by mouth daily as needed for cough or to loosen phlegm.   Yes Historical Provider, MD  levothyroxine (SYNTHROID, LEVOTHROID) 50 MCG tablet TAKE ONE TABLET BY MOUTH ONCE DAILY BEFORE BREAKFAST 11/26/14  Yes Unk Pinto, MD  losartan (COZAAR) 100 MG tablet Take 1 tablet (100 mg total) by mouth daily. for blood pressure 03/30/15  Yes Unk Pinto, MD  meloxicam (MOBIC) 15 MG tablet TAKE ONE TABLET BY MOUTH ONE TIME DAILY AFTER A MEAL FOR ARTHRITIS 04/25/15  Yes Unk Pinto, MD  minoxidil (LONITEN) 10 MG tablet Take 1/4 to 1/2 to 1 tablet daily as directed for BP Patient taking differently: Take 5 mg by mouth daily.  01/31/14  Yes Unk Pinto, MD  pravastatin (PRAVACHOL) 40 MG tablet TAKE 1 TABLET BY MOUTH AT BEDTIME FOR CHOLESTEROL 01/29/15  Yes Unk Pinto, MD  tamsulosin  (FLOMAX) 0.4 MG CAPS capsule TAKE ONE CAPSULE BY MOUTH ONE TIME DAILY 01/12/15  Yes Unk Pinto, MD  betamethasone dipropionate (DIPROLENE) 0.05 % ointment Apply topically 2 (two) times daily. Patient not taking: Reported on 06/09/2015 07/12/14   Unk Pinto, MD   Allergies  Allergen Reactions  . Cardura [Doxazosin Mesylate]     Unknown reaction   . Ace Inhibitors Cough    Review of Systems  Constitutional: Positive for chills, diaphoresis, activity change, appetite change and fatigue. Negative for fever and unexpected weight change.  Respiratory: Positive for cough, shortness of breath and wheezing.   Cardiovascular: Positive for palpitations.  Endocrine: Positive for cold intolerance.  Genitourinary: Positive for urgency.  Musculoskeletal: Positive for back pain and arthralgias.    Physical Exam  Constitutional: He appears well-developed.  HENT:  Head: Normocephalic and atraumatic.  Eyes: Pupils are equal, round, and reactive to light.  Neck:  JVD present.  Cardiovascular: Normal heart sounds.   Respiratory: No respiratory distress. He has wheezes.  GI: He exhibits no distension. There is no tenderness.  Musculoskeletal: He exhibits edema.  Lymphadenopathy:    He has cervical adenopathy.  Psychiatric: He has a normal mood and affect. His behavior is normal. Judgment and thought content normal.    Vital Signs: BP 90/48 mmHg  Pulse 75  Temp(Src) 97.9 F (36.6 C) (Oral)  Resp 48  Ht 6' (1.829 m)  Wt 87.8 kg (193 lb 9 oz)  BMI 26.25 kg/m2  SpO2 89%  SpO2: SpO2: (!) 89 % O2 Device:SpO2: (!) 89 % O2 Flow Rate: .O2 Flow Rate (L/min): 4 L/min  IO: Intake/output summary:  Intake/Output Summary (Last 24 hours) at 06/10/15 1415 Last data filed at 06/10/15 1333  Gross per 24 hour  Intake    270 ml  Output    350 ml  Net    -80 ml    LBM:   Baseline Weight: Weight: 87.8 kg (193 lb 9 oz) Most recent weight: Weight: 87.8 kg (193 lb 9 oz)      Palliative  Assessment/Data:  Flowsheet Rows        Most Recent Value   Intake Tab    Referral Department  Critical care   Unit at Time of Referral  Intermediate Care Unit   Palliative Care Primary Diagnosis  Cancer   Date Notified  06/09/15   Palliative Care Type  New Palliative care   Reason for referral  Clarify Goals of Care, Non-pain Symptom   Date of Admission  06/09/15   Date first seen by Palliative Care  06/09/15   # of days Palliative referral response time  0 Day(s)   # of days IP prior to Palliative referral  0   Clinical Assessment    Palliative Performance Scale Score  40%   Pain Max last 24 hours  4   Pain Min Last 24 hours  4   Dyspnea Max Last 24 Hours  8   Dyspnea Min Last 24 hours  8   Nausea Max Last 24 Hours  0   Nausea Min Last 24 Hours  0   Anxiety Max Last 24 Hours  4   Anxiety Min Last 24 Hours  4   Psychosocial & Spiritual Assessment    Palliative Care Outcomes    Patient/Family meeting held?  No   Palliative Care Outcomes  Improved non-pain symptom therapy, Counseled regarding hospice, Changed to focus on comfort      Additional Data Reviewed:  CBC:    Component Value Date/Time   WBC 13.3* 06/10/2015 0345   HGB 12.2* 06/10/2015 0345   HCT 38.4* 06/10/2015 0345   PLT 269 06/10/2015 0345   MCV 87.3 06/10/2015 0345   NEUTROABS 13.6* 06/07/2015 1130   LYMPHSABS 0.9 06/07/2015 1130   MONOABS 1.4* 06/07/2015 1130   EOSABS 0.2 06/07/2015 1130   BASOSABS 0.0 06/07/2015 1130   Comprehensive Metabolic Panel:    Component Value Date/Time   NA 142 06/10/2015 0345   K 3.9 06/10/2015 0345   CL 103 06/10/2015 0345   CO2 29 06/10/2015 0345   BUN 16 06/10/2015 0345   CREATININE 1.10 06/10/2015 0345   CREATININE 1.00 05/11/2015 1520   GLUCOSE 107* 06/10/2015 0345   CALCIUM 8.2* 06/10/2015 0345   AST 13* 06/10/2015 0345   ALT 10* 06/10/2015 0345   ALKPHOS 48 06/10/2015 0345   BILITOT 0.9 06/10/2015 0345   PROT  5.4* 06/10/2015 0345   ALBUMIN 2.5* 06/10/2015  0345     Time In: 7:30PM  Time Out: 8:30PM Time Total: 60 minutes Greater than 50%  of this time was spent counseling and coordinating care related to the above assessment and plan.  Signed by: Roma Schanz, DO  06/10/2015, 2:15 PM  Please contact Palliative Medicine Team phone at [phone] for questions and concerns.

## 2015-06-10 NOTE — Progress Notes (Signed)
I spoke with attending Dr. Kerry Kass expressed concern that they cannot care for him at home with hospice due to his current level of symptom management and care needs. A hospice facility would be appropriate given his prognosis and desire for full comfort care approach. Prognosis <4 weeks, probably much less given the extent of his disease and the volume of the pleural fluid removed-I expect this to re-accumulate quickly and based on his expressed goals I do not think a pleurx catheter would be helpful. He will probably rapidly progress to needing IV opiates to control dyspnea and respiratory failure from the malignant effusion. Will place CSW order to offer choice for hospice facility.  Lane Hacker, DO Palliative Medicine'

## 2015-06-10 NOTE — Progress Notes (Signed)
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 Presents to the ED w/ 2wk h/o progressive dyspnea now to the point that his is in acute distress. On ED evaluation found to have large to mod right effusion and wide spread parenchymal involvement of the lung. PCCM was asked to evaluate given the effusion   SIGNIFICANT EVENTS  12/29 - Admit & Thoracentesis  STUDIES:  PET scan 12/15:  1. Extensive pulmonary metastatic disease is identified throughout both lungs. [id number] lesion or stage IV disease.  2. Left cervical, bilateral mediastinal and bilateral hilar, & left axillary lymph node metastasis. 3. Evidence of trans pleural spread of tumor involving both hemithoraces. 4. 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 5. Indeterminate left adrenal nodule exhibits low level FDG uptake.  12/27 surgical path: Lymph node, needle/core biopsy, left supraclavicular. POORLY DIFFERENTIATED CARCINOMA CONSISTENT WITH ADENOCARCINOMA OF THE LUNG PRIMARY.   RIGHT PFA (06/09/15): Glucose:  130 LDH:  299 Total Protein:  3.2 WBC:  776 (62% lymph, 4% eos, 21% neutro, 13% monocytes) Cytology:  Pending Culture:  Pending  SUBJECTIVE: Patient underwent right-sided thoracentesis yesterday with removal of 1.5 L "amber colored" fluid. Patient reports he slept well last night. Denies any chest pain or pressure. Reports dyspnea has significantly improved. He is having some mild discomfort around the site of his thoracentesis. Patient was seen by palliative care last night as well in consultation. Patient does report that he has had new feelings of claustrophobia even when she eats at times. He does not particularly feel anxious at this time. He also reports he has previously had  some intermittent cause in his left eye that are transient and peripheral.  REVIEW OF SYSTEMS:  Denies any subjective fever, chills, or sweats. Denies any nausea or vomiting.  VITAL SIGNS: Temp:  [97.4 F (36.3 C)-98.9 F (37.2 C)] 97.4 F (36.3 C) (12/30 0800) Pulse Rate:  [89-126] 107 (12/29 2000) Resp:  [23-50] 24 (12/29 2000) BP: (109-175)/(58-105) 114/58 mmHg (12/30 0739) SpO2:  [88 %-96 %] 90 % (12/30 0752) Weight:  [193 lb 9 oz (87.8 kg)] 193 lb 9 oz (87.8 kg) (12/29 1800)  PHYSICAL EXAMINATION: General:  Elderly male. No distress. Resting comfortably laying on his right side.  Neuro:  Oriented 4. No meningismus. Moving all 4 extremities equally. HEENT:  No scleral icterus or injection. No oral ulcers appreciated. Cardiovascular:  Regular rhythm. No appreciable JVD. Normal S1 & S2. Lungs:  Improved aeration right base. Normal work of breathing on nasal cannula oxygen. Speaking in complete sentences.   Recent Labs Lab 06/07/15 1130 06/09/15 1110 06/10/15 0345  NA 139 138 142  K 4.0 3.9 3.9  CL 101 101 103  CO2 '28 27 29  '$ BUN '16 16 16  '$ CREATININE 0.98 1.01 1.10  GLUCOSE 121* 117* 107*    Recent Labs Lab 06/07/15 1130 06/09/15 1110 06/10/15 0345  HGB 13.6 13.7 12.2*  HCT 42.0 40.8 38.4*  WBC 16.1* 16.3* 13.3*  PLT 319 314 269   Dg Chest 1 View  06/09/2015  CLINICAL DATA:  Patient status post right thoracentesis. EXAM: CHEST 1 VIEW COMPARISON:  Chest radiograph 06/09/2015; PET-CT 05/26/2015 FINDINGS: Monitoring leads overlie the patient. Stable enlarged cardiac and mediastinal contours. Interval decrease in size of now  small right pleural effusion. Re- demonstrated heterogeneous opacities right lung base. Innumerable bilateral pulmonary nodules are demonstrated. No definite pneumothorax. Regional skeleton is unremarkable. IMPRESSION: Interval decrease in size of now small right pleural effusion. Heterogeneous opacities right lung base likely represent atelectasis.  Re- demonstrated innumerable bilateral pulmonary nodules compatible with metastasis. Electronically Signed   By: Lovey Newcomer M.D.   On: 06/09/2015 16:36   Dg Chest 2 View  06/09/2015  CLINICAL DATA:  Metastatic lung carcinoma. Shortness of breath, progressive over past week EXAM: CHEST  2 VIEW COMPARISON:  Chest radiograph May 11, 2015; chest CT May 17, 2015 FINDINGS: There is a moderate pleural effusion on the right, slightly larger compared to most recent study. There is compressive atelectasis in the right base. There is widespread pulmonary metastatic disease with nodular lesions throughout both lungs, not appreciably changed. The heart is upper normal in size with pulmonary vascularity within normal limits. Adenopathy is present, better seen on CT examination. There is degenerative change in the thoracic spine with diffuse idiopathic skeletal hyperostosis. There are no blastic or lytic bone lesions appreciable radiographically. IMPRESSION: Widespread parenchymal metastatic disease. Moderate right pleural effusion, larger compared to most recent prior study. Compressive atelectasis on the right noted. No change in cardiac silhouette. Adenopathy is better seen on CT than on radiographic examination. There is diffuse idiopathic skeletal hyperostosis in the thoracic spine. Electronically Signed   By: Lowella Grip III M.D.   On: 06/09/2015 12:06   US Thoracentesis Asp Pleural Space W/img Guide  06/09/2015  INDICATION: Symptomatic right sided pleural effusion EXAM: US THORACENTESIS ASP PLEURAL SPACE W/IMG GUIDE COMPARISON:  CXR 06/09/2015. MEDICATIONS: None COMPLICATIONS: None immediate TECHNIQUE: Informed written consent was obtained from the patient after a discussion of the risks, benefits and alternatives to treatment. A timeout was performed prior to the initiation of the procedure. Initial ultrasound scanning demonstrates a right pleural effusion. The lower chest was prepped and draped in  the usual sterile fashion. 1% lidocaine was used for local anesthesia. An ultrasound image was saved for documentation purposes. A 6 Fr Safe-T-Centesis catheter was introduced. The thoracentesis was performed. The catheter was removed and a dressing was applied. The patient tolerated the procedure well without immediate post procedural complication. The patient was escorted to have an upright chest radiograph. FINDINGS: A total of approximately 1.5 liters of amber colored fluid was removed. Requested samples were sent to the laboratory. IMPRESSION: Successful ultrasound-guided right sided thoracentesis yielding 1.5 liters of pleural fluid. Read By:  Tsosie Billing PA-C Electronically Signed   By: Corrie Mckusick D.O.   On: 06/09/2015 16:21    ASSESSMENT / PLAN: 81 year old male with metastatic adenocarcinoma the lung. I suspect he has stage IV lung cancer. His pleural fluid analysis is consistent with an exudate and given the high percentage of lymphocytes I am highly suspicious this represents a malignant effusion. I did have a lengthy discussion this morning with the patient regarding his life expectancy. He does not wish to have invasive interventions performed but would be amenable to repeat thoracentesis if necessary for his right pleural effusion given the significant relief in his dyspnea he has experienced. I am suspicious about possible cerebral metastases given his description and he has not yet had a brain MRI. However, the patient does not desire any radiation therapy at this time. I did explain that radiation therapy could be performed for relief of pain from bone metastasis if he desired.  1. Exudative right pleural effusion: Right pleural fluid  cytology pending. Suspect malignant or paramalignant pleural effusion either way the patient's life expectancy is approximately 6 months.  2. Metastatic adenocarcinoma the lung: Palliative care has been consulted. Plan is for discharge to home with hospice.  Patient does not desire any invasive interventions at this time.   Sonia Baller Ashok Cordia, M.D. [facility] Pager:  [phone] After 3pm or if no response, call [phone] 06/10/2015, 9:00 AM

## 2015-06-10 NOTE — Progress Notes (Signed)
Referral has been made to [place] at [facility] . CSW will speak nurse liaison in the am for an update. [place] liaison will also be contacted in the am to see if there is any change in bed status.. Family has been updated.  Werner Lean LCSW [phone]

## 2015-06-10 NOTE — Progress Notes (Signed)
CSW consulted to assist with d/c planning. Palliative Care Team is following. CSW will assist with disposition once recommendations have been made.  Werner Lean LCSW [phone]

## 2015-06-10 NOTE — Progress Notes (Signed)
Patient seen and examined, now awaiting for hospice facility placement. Appreciate palliative care input.  Family updated, anticipated discharge once placement arranged.

## 2015-06-11 DIAGNOSIS — Z66 Do not resuscitate: Secondary | ICD-10-CM

## 2015-06-11 LAB — RESPIRATORY VIRUS PANEL
Adenovirus: NEGATIVE
INFLUENZA A: NEGATIVE
INFLUENZA B 1: NEGATIVE
Metapneumovirus: NEGATIVE
PARAINFLUENZA 1 A: NEGATIVE
PARAINFLUENZA 3 A: NEGATIVE
Parainfluenza 2: NEGATIVE
RESPIRATORY SYNCYTIAL VIRUS A: NEGATIVE
Respiratory Syncytial Virus B: NEGATIVE
Rhinovirus: NEGATIVE

## 2015-06-11 LAB — PH, BODY FLUID: pH, Body Fluid: 7.9

## 2015-06-11 MED ORDER — HYDROCODONE-ACETAMINOPHEN 10-325 MG PO TABS: 1.0000 | ORAL_TABLET | Freq: Every day | ORAL | Status: AC

## 2015-06-11 MED ORDER — DIPHENHYDRAMINE HCL 50 MG/ML IJ SOLN
12.5000 mg | Freq: Once | INTRAMUSCULAR | Status: AC
  Administered 2015-06-11: 12.5 mg via INTRAVENOUS
  Filled 2015-06-11: qty 1

## 2015-06-11 MED ORDER — LORAZEPAM 2 MG/ML PO CONC: 0.5000 mg | Freq: Four times a day (QID) | ORAL | Status: AC | PRN

## 2015-06-11 MED ORDER — DIPHENHYDRAMINE HCL 25 MG PO TABS: 25.0000 mg | ORAL_TABLET | Freq: Four times a day (QID) | ORAL | Status: AC | PRN

## 2015-06-11 MED ORDER — DOXYCYCLINE HYCLATE 100 MG PO CAPS: 100.0000 mg | ORAL_CAPSULE | Freq: Two times a day (BID) | ORAL | Status: AC

## 2015-06-11 MED ORDER — MUPIROCIN 2 % EX OINT: 1.0000 "application " | TOPICAL_OINTMENT | Freq: Two times a day (BID) | CUTANEOUS | Status: AC

## 2015-06-11 MED ORDER — TRAZODONE HCL 50 MG PO TABS: 50.0000 mg | ORAL_TABLET | Freq: Every day | ORAL | Status: AC

## 2015-06-11 MED ORDER — CHLORHEXIDINE GLUCONATE CLOTH 2 % EX PADS: 6.0000 | MEDICATED_PAD | Freq: Every day | CUTANEOUS | Status: AC

## 2015-06-11 MED ORDER — ALBUTEROL SULFATE HFA 108 (90 BASE) MCG/ACT IN AERS: 2.0000 | INHALATION_SPRAY | Freq: Four times a day (QID) | RESPIRATORY_TRACT | Status: AC | PRN

## 2015-06-11 NOTE — Discharge Summary (Signed)
Discharge Summary  Kevin Ellison:[id number] DOB: 29-Mar-1934  PCP: Alesia Richards, MD  Admit date: 06/09/2015 Discharge date: 06/11/2015  Time spent: <85mns  Recommendations for Outpatient Follow-up:  1. Patient is discharged to [facility] on comfort measures  Discharge Diagnoses:  Active Hospital Problems   Diagnosis Date Noted  . Respiratory distress 06/09/2015  . DNR (do not resuscitate) 06/09/2015    Resolved Hospital Problems   Diagnosis Date Noted Date Resolved  No resolved problems to display.    Discharge Condition: stable  Diet recommendation: regular diet  Filed Weights   06/09/15 1800 06/11/15 0338  Weight: 193 lb 9 oz (87.8 kg) 191 lb 12.8 oz (87 kg)    History of present illness:  Kevin VOLNERis a 81y.o. male  With h/o htn, HLD, hypothyroid, [facility] patient reported he was doing well until a month ago, he started to have significant weight loss and progressive sob, he was seen by pulmonology due to concerning for metastatic lung cancer with bone mets, a biopsy of left supraclavicular node was done on on 12/27, pathology showed adenocarcinoma of the lung. Over The past week, he became more sob, not able to sleep at night, he presented to the ED. He is found to be tachypneic in the 30's and 40's, sinus tachcyardia, he was put on 5liter oxygen supplement, sats remain low 90's. initial cxr showed right pleural effusion. Labs with leukocytosis, lactic acid wnl. He was found wheezing on exam, he is treated with nebs/abx, hospitalist called to admit the patient. Patient denies fever, does report running nose, denies sore Throat, no diarrhea, no chest pain, no lower extremity edema. He quit smoking [id number].  Hospital Course:  Active Problems:   Respiratory distress   DNR (do not resuscitate)  Hypoxic respiratory failure/ respiratory distress/right pleural effusion, likely malignant effusion: admitted  to stepdown uint with newly diagnosed widely spread adenocarcinoma of the lung,  right pleural effusion, s/p STAT US[place]ore[place]ided thoracentesis,  Received abx, nebs. Improved, less tachypeneic, sinus tachycardia has resolved, Pulm/critial care and Oncology also consulted. After discussion with patient and family, they decided do not want aggressive treatment, want to discharge to hospice facility with comfort measures.  Palliative care input appreciated, patient is discharged to [facility] onf 12/31. At discharge , patient's main concerns is want to get enough sleep, reported he has not been able to sleep for the last week due to sob. Benadryal/trazodone ordered. Hospice facility to continue titrate meds for comfort.  Leukocytosis: no fever, from stress vs infection, received abx with rocephin and zithromax in the hospital, discharged with doxycycline for another 5 days  HTN/HLD/hypothyroidsm: continue home meds including lasix. Patient may stop taking this meds if he chooses to.  Prediabetes, diet controlled,   BPH: continued on proscan and flomax  MRSA colonization, started decolonization with bactroban nasal cream and chlorhexidine cloth   Consultants:  Pulmonary/critical care Oncology ( talked to Dr. GuLindi AdiePalliative care  Code Status: DNR  Family Communication: Patient and daughter  Disposition Plan: [facility] on 12/31   Discharge Exam: BP 122/51 mmHg  Pulse 80  Temp(Src) 98.3 F (36.8 C) (Oral)  Resp 20  Ht 6' (1.829 m)  Wt 191 lb 12.8 oz (87 kg)  BMI 26.01 kg/m2  SpO2 92%   General: less tachypneic  Eyes: PERRL  ENT: unremarkable  Neck: supple, no JVD  Cardiovascular: sinus tachycardia resolved  Respiratory:  diminished on the right, mild wheezing on the left  Abdomen: soft/ND/ND, positive bowel sounds  Skin: no rash  Musculoskeletal: No edema  Psychiatric: calm/cooperative  Neurologic: no focal findings     Discharge Instructions You were cared for by a hospitalist during your hospital stay. If you have any questions about your discharge medications or the care you received while you were in the hospital after you are discharged, you can call the unit and asked to speak with the hospitalist on call if the hospitalist that took care of you is not available. Once you are discharged, your primary care physician will handle any further medical issues. Please note that NO REFILLS for any discharge medications will be authorized once you are discharged, as it is imperative that you return to your primary care physician (or establish a relationship with a primary care physician if you do not have one) for your aftercare needs so that they can reassess your need for medications and monitor your lab values.      Discharge Instructions    Diet - low sodium heart healthy    Complete by:  As directed      Increase activity slowly    Complete by:  As directed             Medication List    STOP taking these medications        betamethasone dipropionate 0.05 % ointment  Commonly known as:  DIPROLENE     diphenhydramine-acetaminophen 25-500 MG Tabs tablet  Commonly known as:  TYLENOL PM     pravastatin 40 MG tablet  Commonly known as:  PRAVACHOL      TAKE these medications        albuterol 108 (90 Base) MCG/ACT inhaler  Commonly known as:  PROVENTIL HFA;VENTOLIN HFA  Inhale 2 puffs into the lungs every 6 (six) hours as needed for wheezing or shortness of breath.     bisoprolol-hydrochlorothiazide 10-6.25 MG tablet  Commonly known as:  ZIAC  TAKE ONE TABLET BY MOUTH EVERY MORNING FOR BLOOD PRESSURE     Chlorhexidine Gluconate Cloth 2 % Pads  Apply 6 each topically daily at 6 (six) AM.     diphenhydrAMINE 25 MG tablet  Commonly known as:  BENADRYL  Take 1 tablet (25 mg total) by mouth every 6 (six) hours as needed for itching or sleep.     doxycycline 100 MG capsule  Commonly known  as:  VIBRAMYCIN  Take 1 capsule (100 mg total) by mouth 2 (two) times daily.     finasteride 5 MG tablet  Commonly known as:  PROSCAR  TAKE 1 TABLET BY MOUTH DAILY FOR PROSTATE     furosemide 40 MG tablet  Commonly known as:  LASIX  take 1 tablet by mouth daily for blood pressure and fluid     guaiFENesin 600 MG 12 hr tablet  Commonly known as:  MUCINEX  Take 600 mg by mouth daily as needed for cough or to loosen phlegm.     HYDROcodone-acetaminophen 10-325 MG tablet  Commonly known as:  NORCO  Take 1 tablet by mouth at bedtime.     levothyroxine 50 MCG tablet  Commonly known as:  SYNTHROID, LEVOTHROID  TAKE ONE TABLET BY MOUTH ONCE DAILY BEFORE BREAKFAST     LORazepam 2 MG/ML concentrated solution  Commonly known as:  ATIVAN  Take 0.3 mLs (0.6 mg total) by mouth every 6 (six) hours as needed for anxiety.     losartan 100  MG tablet  Commonly known as:  COZAAR  Take 1 tablet (100 mg total) by mouth daily. for blood pressure     meloxicam 15 MG tablet  Commonly known as:  MOBIC  TAKE ONE TABLET BY MOUTH ONE TIME DAILY AFTER A MEAL FOR ARTHRITIS     minoxidil 10 MG tablet  Commonly known as:  LONITEN  Take 1/4 to 1/2 to 1 tablet daily as directed for BP     mupirocin ointment 2 %  Commonly known as:  BACTROBAN  Place 1 application into the nose 2 (two) times daily.     tamsulosin 0.4 MG Caps capsule  Commonly known as:  FLOMAX  TAKE ONE CAPSULE BY MOUTH ONE TIME DAILY     traZODone 50 MG tablet  Commonly known as:  DESYREL  Take 1 tablet (50 mg total) by mouth at bedtime.       Allergies  Allergen Reactions  . Cardura [Doxazosin Mesylate]     Unknown reaction   . Ace Inhibitors Cough      The results of significant diagnostics from this hospitalization (including imaging, microbiology, ancillary and laboratory) are listed below for reference.    Significant Diagnostic Studies: Dg Chest 1 View  06/09/2015  CLINICAL DATA:  Patient status post right  thoracentesis. EXAM: CHEST 1 VIEW COMPARISON:  Chest radiograph 06/09/2015; PET-CT 05/26/2015 FINDINGS: Monitoring leads overlie the patient. Stable enlarged cardiac and mediastinal contours. Interval decrease in size of now small right pleural effusion. Re- demonstrated heterogeneous opacities right lung base. Innumerable bilateral pulmonary nodules are demonstrated. No definite pneumothorax. Regional skeleton is unremarkable. IMPRESSION: Interval decrease in size of now small right pleural effusion. Heterogeneous opacities right lung base likely represent atelectasis. Re- demonstrated innumerable bilateral pulmonary nodules compatible with metastasis. Electronically Signed   By: Lovey Newcomer M.D.   On: 06/09/2015 16:36   Dg Chest 2 View  06/09/2015  CLINICAL DATA:  Metastatic lung carcinoma. Shortness of breath, progressive over past week EXAM: CHEST  2 VIEW COMPARISON:  Chest radiograph May 11, 2015; chest CT May 17, 2015 FINDINGS: There is a moderate pleural effusion on the right, slightly larger compared to most recent study. There is compressive atelectasis in the right base. There is widespread pulmonary metastatic disease with nodular lesions throughout both lungs, not appreciably changed. The heart is upper normal in size with pulmonary vascularity within normal limits. Adenopathy is present, better seen on CT examination. There is degenerative change in the thoracic spine with diffuse idiopathic skeletal hyperostosis. There are no blastic or lytic bone lesions appreciable radiographically. IMPRESSION: Widespread parenchymal metastatic disease. Moderate right pleural effusion, larger compared to most recent prior study. Compressive atelectasis on the right noted. No change in cardiac silhouette. Adenopathy is better seen on CT than on radiographic examination. There is diffuse idiopathic skeletal hyperostosis in the thoracic spine. Electronically Signed   By: Lowella Grip III M.D.   On:  06/09/2015 12:06   Ct Chest W Contrast  05/17/2015  CLINICAL DATA:  Unexplained weight loss EXAM: CT CHEST, ABDOMEN, AND PELVIS WITH CONTRAST TECHNIQUE: Multidetector CT imaging of the chest, abdomen and pelvis was performed following the standard protocol during bolus administration of intravenous contrast. CONTRAST:  70m OMNIPAQUE IOHEXOL 300 MG/ML SOLN, 765mOMNIPAQUE IOHEXOL 300 MG/ML SOLN COMPARISON:  None. FINDINGS: CT CHEST FINDINGS Mediastinum/Nodes: Heart size appears normal. No pericardial effusion. The trachea appears patent and is midline. Normal appearance of the esophagus. Aortic atherosclerosis noted. LAD, RCA and left circumflex coronary artery  calcifications noted. Extensive thoracic adenopathy is identified. Left axillary lymph node measures 1.8 cm, image 12 of series 2. Left supraclavicular lymph node measures 1.2 cm, image 6 of series 2. Index right paratracheal lymph node measures 2.3 cm, image 21 of series 2. Index sub- carinal node measures 3.9 cm, image 30 of series 2. Lungs/Pleura: Small right pleural effusion. Numerous pulmonary nodules and masses are present throughout both lungs compatible with widespread pulmonary metastasis. Index lesion within the right upper lobe measures 4.1 cm, image 21 of series 4. Index lesion in the right lower lobe measures 2.8 cm, image 41 of series 4. The index lesion within the left upper lobe measures 2 cm, image 34 of series 4. Musculoskeletal: Degenerative disc disease noted within the thoracic spine. No aggressive lytic or sclerotic bone lesions identified. CT ABDOMEN PELVIS FINDINGS Hepatobiliary: There is a low attenuation structure along the dome of liver which measures 8 mm, image 51 of series 2. This is too small to characterize. Gallbladder is normal. No biliary dilatation. Pancreas: Normal appearance of the pancreas. Spleen: The spleen is negative. Adrenals/Urinary Tract: The right adrenal gland is normal. There is a nodule in the left adrenal  gland which measures 1.6 cm, image 61 of series 2. Bilateral renal cysts noted. The urinary bladder appears normal. Stomach/Bowel: The stomach and the small bowel loops have a normal course and caliber. The appendix is visualized and appears normal. Unremarkable appearance of the colon. Vascular/Lymphatic: Calcified atherosclerotic disease involves the abdominal aorta. No aneurysm. No enlarged retroperitoneal or mesenteric adenopathy. No enlarged pelvic or inguinal lymph nodes. Reproductive: Mild prostate gland enlargement. Other: No free fluid or fluid collections identified within the upper abdomen. Musculoskeletal: No aggressive lytic or sclerotic bone lesions identified. All degenerative disc disease is identified within the lumbar spine. This is most advanced at the L5-S1 level. IMPRESSION: 1. Widespread pulmonary metastasis with numerous nodules and masses identified both lungs. 2. Extensive thoracic metastasis with bilateral mediastinal adenopathy, left supraclavicular adenopathy and left axillary adenopathy. 3. Right pleural effusion 4. Indeterminate nodule and left adrenal gland measures 1.6 cm. Cannot rule out metastatic disease. 5. Thoracic and lumbar degenerative disc disease. 6. Aortic atherosclerosis and multi vessel coronary artery calcification Electronically Signed   By: Kerby Moors M.D.   On: 05/17/2015 11:58   Ct Abdomen Pelvis W Contrast  05/17/2015  CLINICAL DATA:  Unexplained weight loss EXAM: CT CHEST, ABDOMEN, AND PELVIS WITH CONTRAST TECHNIQUE: Multidetector CT imaging of the chest, abdomen and pelvis was performed following the standard protocol during bolus administration of intravenous contrast. CONTRAST:  4m OMNIPAQUE IOHEXOL 300 MG/ML SOLN, 711mOMNIPAQUE IOHEXOL 300 MG/ML SOLN COMPARISON:  None. FINDINGS: CT CHEST FINDINGS Mediastinum/Nodes: Heart size appears normal. No pericardial effusion. The trachea appears patent and is midline. Normal appearance of the esophagus. Aortic  atherosclerosis noted. LAD, RCA and left circumflex coronary artery calcifications noted. Extensive thoracic adenopathy is identified. Left axillary lymph node measures 1.8 cm, image 12 of series 2. Left supraclavicular lymph node measures 1.2 cm, image 6 of series 2. Index right paratracheal lymph node measures 2.3 cm, image 21 of series 2. Index sub- carinal node measures 3.9 cm, image 30 of series 2. Lungs/Pleura: Small right pleural effusion. Numerous pulmonary nodules and masses are present throughout both lungs compatible with widespread pulmonary metastasis. Index lesion within the right upper lobe measures 4.1 cm, image 21 of series 4. Index lesion in the right lower lobe measures 2.8 cm, image 41 of series 4. The index lesion  within the left upper lobe measures 2 cm, image 34 of series 4. Musculoskeletal: Degenerative disc disease noted within the thoracic spine. No aggressive lytic or sclerotic bone lesions identified. CT ABDOMEN PELVIS FINDINGS Hepatobiliary: There is a low attenuation structure along the dome of liver which measures 8 mm, image 51 of series 2. This is too small to characterize. Gallbladder is normal. No biliary dilatation. Pancreas: Normal appearance of the pancreas. Spleen: The spleen is negative. Adrenals/Urinary Tract: The right adrenal gland is normal. There is a nodule in the left adrenal gland which measures 1.6 cm, image 61 of series 2. Bilateral renal cysts noted. The urinary bladder appears normal. Stomach/Bowel: The stomach and the small bowel loops have a normal course and caliber. The appendix is visualized and appears normal. Unremarkable appearance of the colon. Vascular/Lymphatic: Calcified atherosclerotic disease involves the abdominal aorta. No aneurysm. No enlarged retroperitoneal or mesenteric adenopathy. No enlarged pelvic or inguinal lymph nodes. Reproductive: Mild prostate gland enlargement. Other: No free fluid or fluid collections identified within the upper  abdomen. Musculoskeletal: No aggressive lytic or sclerotic bone lesions identified. All degenerative disc disease is identified within the lumbar spine. This is most advanced at the L5-S1 level. IMPRESSION: 1. Widespread pulmonary metastasis with numerous nodules and masses identified both lungs. 2. Extensive thoracic metastasis with bilateral mediastinal adenopathy, left supraclavicular adenopathy and left axillary adenopathy. 3. Right pleural effusion 4. Indeterminate nodule and left adrenal gland measures 1.6 cm. Cannot rule out metastatic disease. 5. Thoracic and lumbar degenerative disc disease. 6. Aortic atherosclerosis and multi vessel coronary artery calcification Electronically Signed   By: Kerby Moors M.D.   On: 05/17/2015 11:58   Nm Pet Image Initial (pi) Skull Base To Thigh  05/26/2015  CLINICAL DATA:  Initial treatment strategy for Lung cancer. EXAM: NUCLEAR MEDICINE PET SKULL BASE TO THIGH TECHNIQUE: 10.4 mCi F-18 FDG was injected intravenously. Full-ring PET imaging was performed from the skull base to thigh after the radiotracer. CT data was obtained and used for attenuation correction and anatomic localization. FASTING BLOOD GLUCOSE:  Value: 109 mg/dl COMPARISON:  None FINDINGS: NECK Hypermetabolic left level 4 lymph node measures 1.2 cm and has an SUV max equal to 7.7, image 41 of series 4. CHEST At the level of the thoracic inlet there are bilateral hypermetabolic lymph nodes. Index node measures 1 cm and has an SUV max equal to 16.7. On the left lymph node measures 1.3 cm and has an SUV max equal to 13.75, image 49 of series 4. Left axillary node measures 1.5 cm and has an SUV max equal to 16.89, image 60 of series 4. Extensive bilateral hypermetabolic and enlarged mediastinal and hilar adenopathy. Index right paratracheal lymph node measures 2.4 cm and has an SUV max equal to 9.6, image 66 of series 4. Pre-vascular lymph node along the left side of the transverse aortic arch measures 1.6  cm and has an SUV max equal to 23 0.7, image 70 of series 4. Large sub- carinal lymph node measures 3.6 cm and has an SUV max equal to 18.76, image 80 of series 4. Bilateral pleural effusions are identified right greater than left. There is evidence of trans pleural spread of tumor involving both hemi thoraces. Hypermetabolic tumor along the posterior right costophrenic sulcus has an SUV max equal to 10.3, image 125 of the PET images. Hypermetabolic pleural tumor along the posterior costophrenic sulcus has an SUV max equal to 10.7, image 107. Innumerable hypermetabolic pulmonary nodules are scattered throughout both lungs.  These are too numerous to count. Index lesion within the right upper lobe measures 4 cm and has an SUV max equal to 18.5, image 69 of series 4. Index nodule within the left upper lobe measures 1.9 cm and has an SUV max equal to 13.16. ABDOMEN/PELVIS Nodule in the left adrenal gland measures 1.7 cm and 11.6 Hounsfield units. SUV max is equal to 3.2, image 116 of series 4. No abnormal scratch set no mass or nodule within the right adrenal gland. No abnormal uptake identified within the liver, pancreas, or spleen. No hypermetabolic lymph nodes within the upper abdomen. No hypermetabolic pelvic adenopathy. SKELETON Hypermetabolic lesion within the left iliac bone is identified. This measures 2.8 cm and has an SUV max equal to 16.1. IMPRESSION: 1. Extensive pulmonary metastatic disease is identified throughout both lungs. Primary site of disease within the chest is un known. Assuming non-small cell histology findings would be consistent with a [id number] lesion or stage IV disease. 2. Left cervical, bilateral mediastinal and bilateral hilar, and left axillary lymph node metastasis. 3. Evidence of trans pleural spread of tumor involving both hemithoraces. 4. 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 5. Indeterminate left adrenal nodule exhibits low level FDG uptake. Electronically Signed   By:  Kerby Moors M.D.   On: 05/26/2015 14:54   US Biopsy  06/07/2015  CLINICAL DATA:  Multiple bilateral pulmonary nodules and lymphadenopathy in the neck and chest. The most accessible initial source for tissue diagnosis are enlarged left supraclavicular/ lower cervical lymph nodes. EXAM: ULTRASOUND GUIDED CORE BIOPSY OF LEFT SUPRACLAVICULAR LYMPH NODE MEDICATIONS: 2.0 mg IV Versed; 50 mcg IV Fentanyl Total Moderate Sedation Time: 10 minutes. PROCEDURE: The procedure, risks, benefits, and alternatives were explained to the patient. Questions regarding the procedure were encouraged and answered. The patient understands and consents to the procedure. The left neck was prepped with chlorhexidine in a sterile fashion, and a sterile drape was applied covering the operative field. A sterile gown and sterile gloves were used for the procedure. Local anesthesia was provided with 1% Lidocaine. Ultrasound was used to localize left supraclavicular lymphadenopathy. An 18 gauge core biopsy device was utilized in obtaining 4 core biopsy samples through an enlarged lymph node. Core biopsy samples were submitted in saline. COMPLICATIONS: None. FINDINGS: The largest left supraclavicular lymph node measures 2 cm in greatest diameter. Solid tissue was obtained from the lymph node. IMPRESSION: Ultrasound-guided core biopsy performed of enlarged left supraclavicular lymph node. Electronically Signed   By: Aletta Edouard M.D.   On: 06/07/2015 16:34   US Thoracentesis Asp Pleural Space W/img Guide  06/09/2015  INDICATION: Symptomatic right sided pleural effusion EXAM: US THORACENTESIS ASP PLEURAL SPACE W/IMG GUIDE COMPARISON:  CXR 06/09/2015. MEDICATIONS: None COMPLICATIONS: None immediate TECHNIQUE: Informed written consent was obtained from the patient after a discussion of the risks, benefits and alternatives to treatment. A timeout was performed prior to the initiation of the procedure. Initial ultrasound scanning demonstrates  a right pleural effusion. The lower chest was prepped and draped in the usual sterile fashion. 1% lidocaine was used for local anesthesia. An ultrasound image was saved for documentation purposes. A 6 Fr Safe-T-Centesis catheter was introduced. The thoracentesis was performed. The catheter was removed and a dressing was applied. The patient tolerated the procedure well without immediate post procedural complication. The patient was escorted to have an upright chest radiograph. FINDINGS: A total of approximately 1.5 liters of amber colored fluid was removed. Requested samples were sent to the laboratory. IMPRESSION: Successful  ultrasound-guided right sided thoracentesis yielding 1.5 liters of pleural fluid. Read By:  Tsosie Billing PA-C Electronically Signed   By: Corrie Mckusick D.O.   On: 06/09/2015 16:21    Microbiology: Recent Results (from the past 240 hour(s))  Fungal stain     Status: None   Collection Time: 06/09/15  4:53 PM  Result Value Ref Range Status   Specimen Description PLEURAL RIGHT  Final   Special Requests NONE  Final   Fungal Smear   Final    NO YEAST OR FUNGAL ELEMENTS SEEN Performed at [facility]    Report Status 06/10/2015 FINAL  Final  Culture, body fluid-bottle     Status: None (Preliminary result)   Collection Time: 06/09/15  4:53 PM  Result Value Ref Range Status   Specimen Description FLUID PLEURAL RIGHT  Final   Special Requests BOTTLES DRAWN AEROBIC AND ANAEROBIC 10CC  Final   Culture   Final    NO GROWTH 2 DAYS Performed at [facility]    Report Status PENDING  Incomplete  Gram stain     Status: None   Collection Time: 06/09/15  4:53 PM  Result Value Ref Range Status   Specimen Description FLUID PLEURAL RIGHT  Final   Special Requests BOTTLES DRAWN AEROBIC AND ANAEROBIC 10CC  Final   Gram Stain   Final    CYTOSPIN SMEAR WBC PRESENT,BOTH PMN AND MONONUCLEAR NO ORGANISMS SEEN Gram Stain Report Called to,Read Back By and Verified With: Earma Reading RN 12.29.16 @ 1838 BY RICEJ Performed at [facility]    Report Status 06/09/2015 FINAL  Final  Respiratory virus panel     Status: None   Collection Time: 06/09/15  5:53 PM  Result Value Ref Range Status   Respiratory Syncytial Virus A Negative Negative Final   Respiratory Syncytial Virus B Negative Negative Final   Influenza A Negative Negative Final   Influenza B Negative Negative Final   Parainfluenza 1 Negative Negative Final   Parainfluenza 2 Negative Negative Final   Parainfluenza 3 Negative Negative Final   Metapneumovirus Negative Negative Final   Rhinovirus Negative Negative Final   Adenovirus Negative Negative Final    Comment: (NOTE) Performed At: [facility] [place], [place] [id number] Lindon Romp MD YH:[id number]   MRSA PCR Screening     Status: Abnormal   Collection Time: 06/09/15  5:53 PM  Result Value Ref Range Status   MRSA by PCR POSITIVE (A) NEGATIVE Final    Comment:        The GeneXpert MRSA Assay (FDA approved for NASAL specimens only), is one component of a comprehensive MRSA colonization surveillance program. It is not intended to diagnose MRSA infection nor to guide or monitor treatment for MRSA infections. RESULT CALLED TO, READ BACK BY AND VERIFIED WITH: SCOTT CROFTS RN 12.29.16 @ [facility]      Labs: Basic Metabolic Panel:  Recent Labs Lab 06/07/15 1130 06/09/15 1110 06/10/15 0345  NA 139 138 142  K 4.0 3.9 3.9  CL 101 101 103  CO2 '28 27 29  '$ GLUCOSE 121* 117* 107*  BUN '16 16 16  '$ CREATININE 0.98 1.01 1.10  CALCIUM 8.5* 8.3* 8.2*   Liver Function Tests:  Recent Labs Lab 06/09/15 1110 06/09/15 1808 06/10/15 0345  AST 13*  --  13*  ALT 12*  --  10*  ALKPHOS 61  --  48  BILITOT 0.8  --  0.9  PROT 6.3* 6.0*  5.4*  ALBUMIN 2.8*  --  2.5*   No results for input(s): LIPASE, AMYLASE in the last 168 hours. No results for input(s): AMMONIA in the last 168 hours. CBC:  Recent  Labs Lab 06/07/15 1130 06/09/15 1110 06/10/15 0345  WBC 16.1* 16.3* 13.3*  NEUTROABS 13.6*  --   --   HGB 13.6 13.7 12.2*  HCT 42.0 40.8 38.4*  MCV 85.7 84.0 87.3  PLT 319 314 269   Cardiac Enzymes:  Recent Labs Lab 06/09/15 1110  TROPONINI 0.04*   BNP: BNP (last 3 results) No results for input(s): BNP in the last 8760 hours.  ProBNP (last 3 results) No results for input(s): PROBNP in the last 8760 hours.  CBG: No results for input(s): GLUCAP in the last 168 hours.     SignedFlorencia Reasons MD, PhD  [facility] 06/11/2015, 9:58 AM

## 2015-06-11 NOTE — Clinical Social Work Placement (Signed)
   CLINICAL SOCIAL WORK PLACEMENT  NOTE  Date:  06/11/2015  Patient Details  Name: Kevin Ellison MRN: [id number] Date of Birth: 01-31-1934  Clinical Social Work is seeking post-discharge placement for this patient at the   level of care (*CSW will initial, date and re-position this form in  chart as items are completed):      Patient/family provided with [place] Work Department's list of facilities offering this level of care within the geographic area requested by the patient (or if unable, by the patient's family).      Patient/family informed of their freedom to choose among providers that offer the needed level of care, that participate in Medicare, Medicaid or managed care program needed by the patient, have an available bed and are willing to accept the patient.      Patient/family informed of [place]'s ownership interest in [facility] and [facility], as well as of the fact that they are under no obligation to receive care at these facilities.  PASRR submitted to EDS on       PASRR number received on       Existing PASRR number confirmed on       FL2 transmitted to all facilities in geographic area requested by pt/family on       FL2 transmitted to all facilities within larger geographic area on       Patient informed that his/her managed care company has contracts with or will negotiate with certain facilities, including the following:            Patient/family informed of bed offers received.  Patient chooses bed at    [facility] [facility]   Physician recommends and patient chooses bed at      Patient to be transferred to  [facility] on  .June 11, 2015  Patient to be transferred to facility by   ambulance    Patient family notified on   June 11, 2015 of transfer.  Name of family member notified:      Daughter and spouse  PHYSICIAN       Additional Comment:     _______________________________________________ Carlean Jews, LCSW 06/11/2015, 12:11 PM

## 2015-06-11 NOTE — Progress Notes (Signed)
Nutrition Brief Note  Pt identified as at nutrition risk on the Malnutrition Screen Tool  Chart reviewed. Pt now transitioning to comfort care.  No nutrition interventions warranted at this time.  Please consult as needed.   Clayton Bibles, MS, RD, LDN Pager: [phone] After Hours Pager: [phone]

## 2015-06-11 NOTE — Progress Notes (Signed)
Kevin Ellison 12[address] TRANSFERRED TO [facility]  BY [facility]. PATIENT WAS ON O2 4L [place]. NO ACUTE DISTRESS. HIS DAUGHTER AND WIFE ARE ALSO ON THEIR WAY TO [place]. REPORT WAS CALLED TO [place]  RN.

## 2015-06-11 NOTE — Clinical Social Work Note (Signed)
CSW spoke with Harmon Pier at [facility] who stated that there was a bed available today for pt.  Per CSW weekday notes pt first choice was [facility] and now that they have a bed today [facility] was notified that they will not need to come and assess pt for their hospice home.  Harmon Pier at [facility] is meeting with pt's spouse and two daughters at 11am this morning to sign paperwork.  Pt may be transferred to [facility] after paperwork is completed.  CSW left message for pt's daughter with phone number to call back  .Dede Query, LCSW [facility] Clinical Social Worker - Weekend Coverage cell #: [phone]

## 2015-06-11 NOTE — Consult Note (Signed)
[facility] [employer]; Received request from [facility] for family interest in [facility] 06/10/15. Chart reviewed and spoke patient's spouse and daughter by phone this morning to confirm interest. They are agreeable to transfer to [facility] today. Plan to meet the at 11:00 this morning to complete paper work. CSW [place] aware. Will re-update when paper work complete.   Thank you.  Erling Conte, [place]

## 2015-06-14 LAB — CULTURE, BODY FLUID-BOTTLE: CULTURE: NO GROWTH

## 2015-06-14 LAB — CULTURE, BODY FLUID W GRAM STAIN -BOTTLE

## 2015-06-15 LAB — CHOLESTEROL, BODY FLUID: CHOL FL: 54 mg/dL

## 2015-06-23 ENCOUNTER — Institutional Professional Consult (permissible substitution): Payer: Medicare Other | Admitting: Pulmonary Disease

## 2015-07-07 ENCOUNTER — Ambulatory Visit: Payer: Self-pay | Admitting: Internal Medicine

## 2015-07-13 DEATH — deceased

## 2015-10-06 ENCOUNTER — Encounter: Payer: Self-pay | Admitting: Internal Medicine

## 2015-12-15 DIAGNOSIS — Z515 Encounter for palliative care: Secondary | ICD-10-CM | POA: Insufficient documentation

## 2016-03-29 IMAGING — CT CT ABD-PELV W/ CM
4 of 5 series · 8 of 46 positions shown, 14 images · IV contrast (APPLIED)
Comparison: None.

CLINICAL DATA: Unexplained weight loss

EXAM:
CT CHEST, ABDOMEN, AND PELVIS WITH CONTRAST
TECHNIQUE: Multidetector CT imaging of the chest, abdomen and pelvis was
performed following the standard protocol during bolus
administration of intravenous contrast.
CONTRAST:  25mL OMNIPAQUE IOHEXOL 300 MG/ML SOLN, 75mL OMNIPAQUE
IOHEXOL 300 MG/ML SOLN

[Series 2: chest/abd/pel 5.0 b31f · axial · 0.89mm/px · 1 of 134 slices shown]
[im 15/134  soft-tissue]
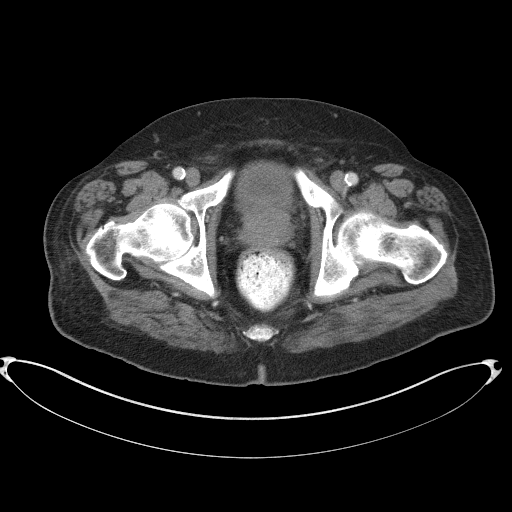

[Series 5: chest/abd/pel 3.0 coronal · coronal · 0.95mm/px · 3 of 114 slices shown, 4 images]
[im 38/114  soft-tissue]
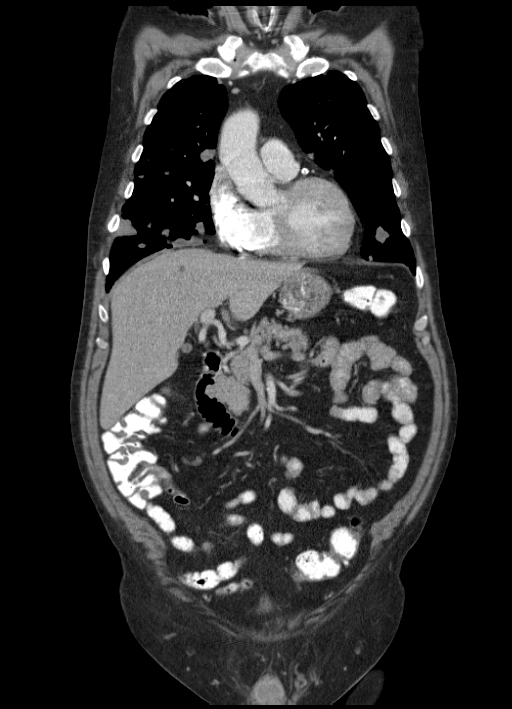
[im 51/114  soft-tissue]
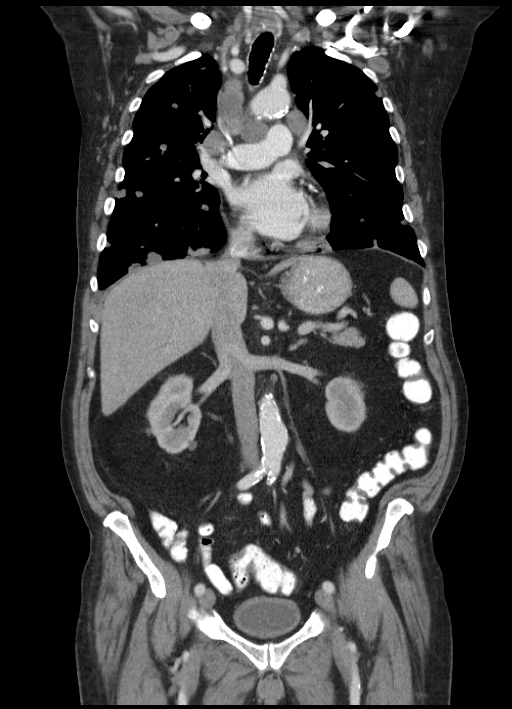
[im 51/114  bone]
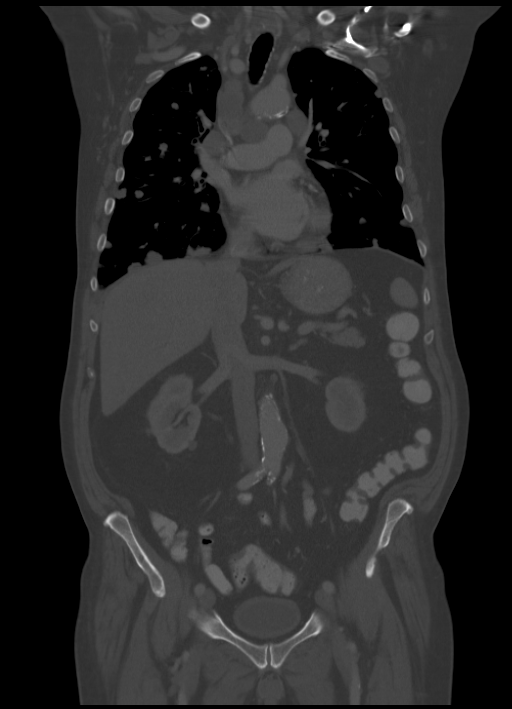
[im 63/114  soft-tissue]
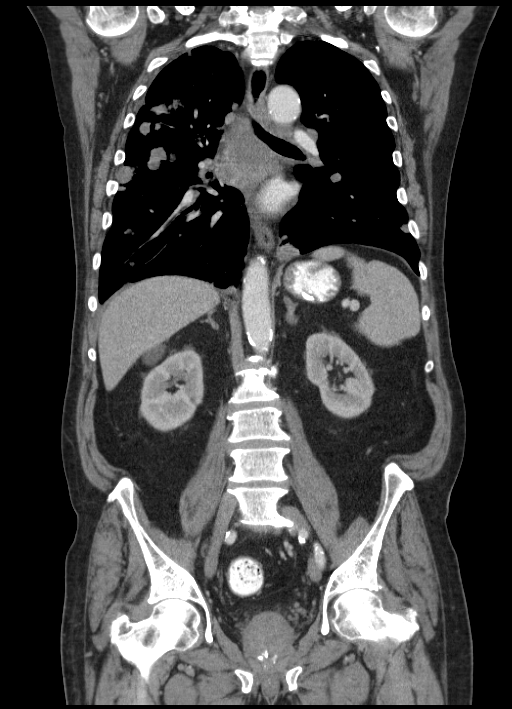

[Series 6: chest/abd/pel 3.0 sagittal · sagittal · 0.74mm/px · 1 of 133 slices shown, 2 images]
[im 45/133  soft-tissue]
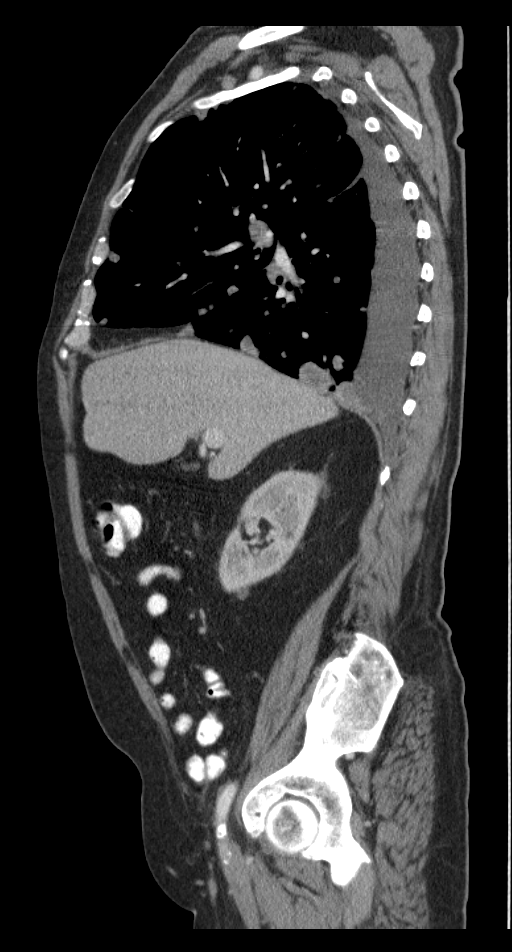
[im 45/133  bone]
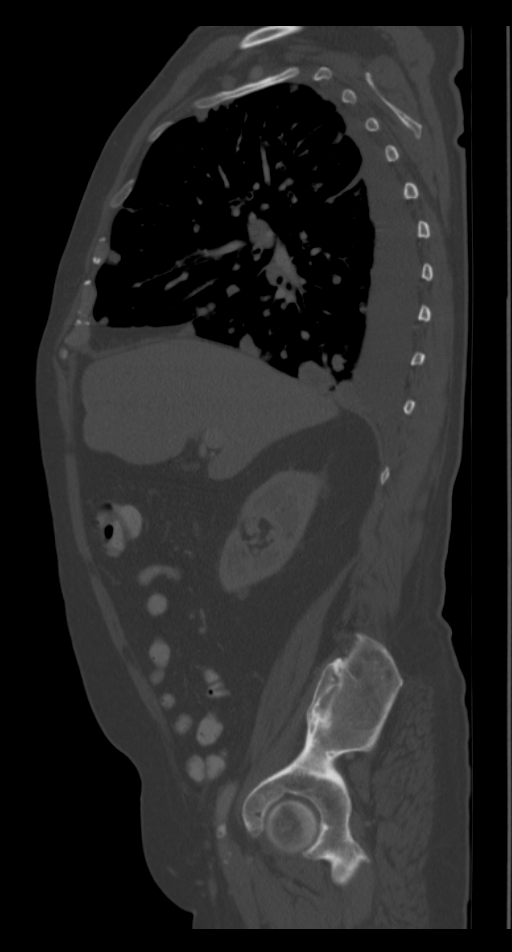

[Series 7: renal delay 5.0 b30f · axial · delayed · 0.88mm/px · z∈[+902,+992]mm · 3 of 38 slices shown, 7 images]
[im 10/38  soft-tissue]
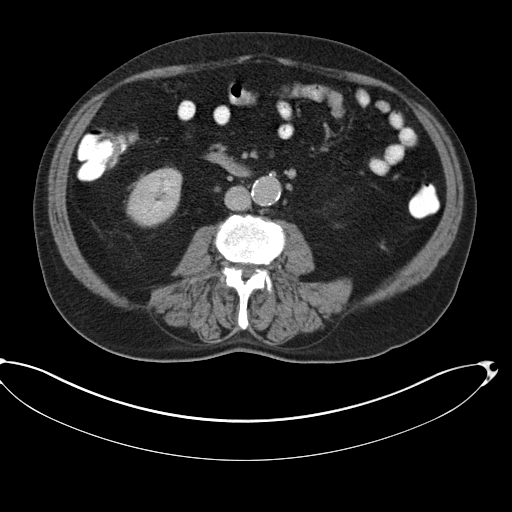
[im 10/38  lung]
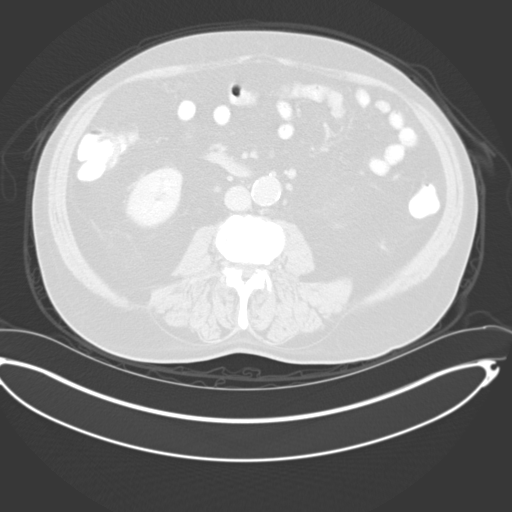
[im 10/38  bone]
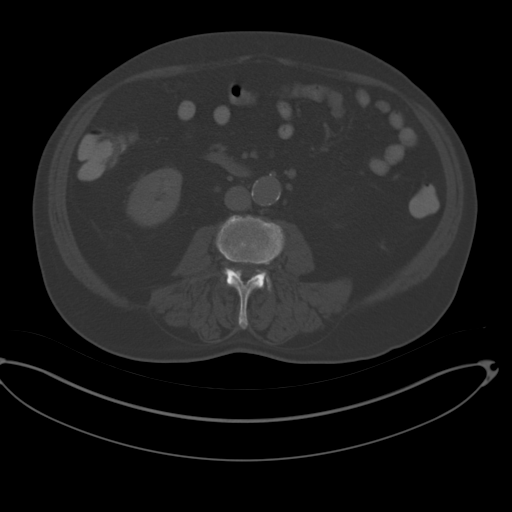
[im 19/38  soft-tissue]
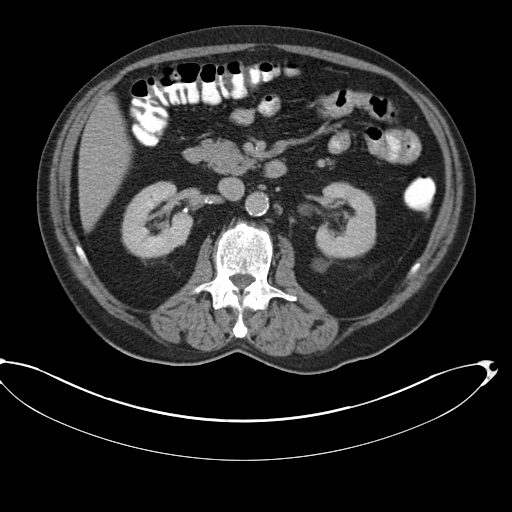
[im 19/38  lung]
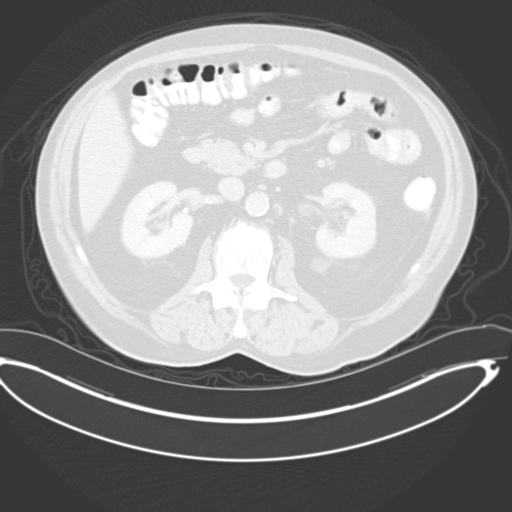
[im 28/38  soft-tissue]
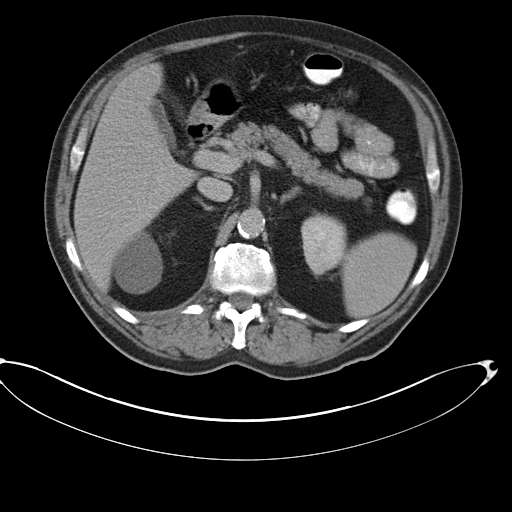
[im 28/38  lung]
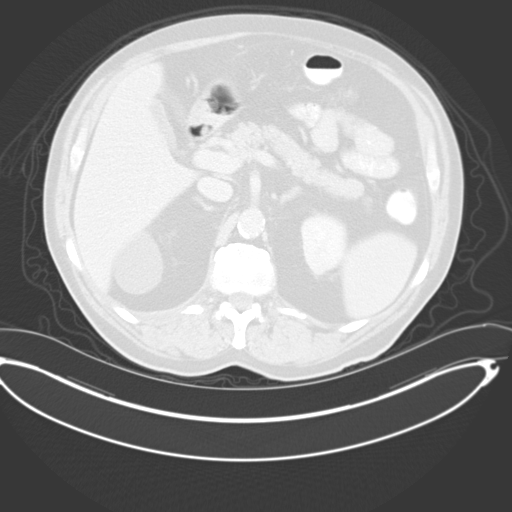

[8 of 46 positions shown; findings below may reference images not displayed]

FINDINGS: CT CHEST FINDINGS

Mediastinum/Nodes: Heart size appears normal. No pericardial
effusion. The trachea appears patent and is midline. Normal
appearance of the esophagus. Aortic atherosclerosis noted. LAD, RCA
and left circumflex coronary artery calcifications noted. Extensive
thoracic adenopathy is identified. Left axillary lymph node measures
1.8 cm, image 12 of series 2. Left supraclavicular lymph node
measures 1.2 cm, image 6 of series 2. Index right paratracheal lymph
node measures 2.3 cm, image 21 of series 2. Index sub- carinal node
measures 3.9 cm, image 30 of series 2.

Lungs/Pleura: Small right pleural effusion. Numerous pulmonary
nodules and masses are present throughout both lungs compatible with
widespread pulmonary metastasis. Index lesion within the right upper
lobe measures 4.1 cm, image 21 of series 4. Index lesion in the
right lower lobe measures 2.8 cm, image 41 of series 4. The index
lesion within the left upper lobe measures 2 cm, image 34 of series
4.

Musculoskeletal: Degenerative disc disease noted within the thoracic
spine. No aggressive lytic or sclerotic bone lesions identified.

CT ABDOMEN PELVIS FINDINGS

Hepatobiliary: There is a low attenuation structure along the dome
of liver which measures 8 mm, image 51 of series 2. This is too
small to characterize. Gallbladder is normal. No biliary dilatation.

Pancreas: Normal appearance of the pancreas.

Spleen: The spleen is negative.

Adrenals/Urinary Tract: The right adrenal gland is normal. There is
a nodule in the left adrenal gland which measures 1.6 cm, image 61
of series 2. Bilateral renal cysts noted. The urinary bladder
appears normal.

Stomach/Bowel: The stomach and the small bowel loops have a normal
course and caliber. The appendix is visualized and appears normal.
Unremarkable appearance of the colon.

Vascular/Lymphatic: Calcified atherosclerotic disease involves the
abdominal aorta. No aneurysm. No enlarged retroperitoneal or
mesenteric adenopathy. No enlarged pelvic or inguinal lymph nodes.

Reproductive: Mild prostate gland enlargement.

Other: No free fluid or fluid collections identified within the
upper abdomen.

Musculoskeletal: No aggressive lytic or sclerotic bone lesions
identified. All degenerative disc disease is identified within the
lumbar spine. This is most advanced at the L5-S1 level.
IMPRESSION: 1. Widespread pulmonary metastasis with numerous nodules and masses
identified both lungs.
2. Extensive thoracic metastasis with bilateral mediastinal
adenopathy, left supraclavicular adenopathy and left axillary
adenopathy.
3. Right pleural effusion
4. Indeterminate nodule and left adrenal gland measures 1.6 cm.
Cannot rule out metastatic disease.
5. Thoracic and lumbar degenerative disc disease.
6. Aortic atherosclerosis and multi vessel coronary artery
calcification
